# Patient Record
Sex: Female | Born: 1970
Health system: Southern US, Community
[De-identification: ages and names within clinical notes are randomized; demographics above are authoritative.]

## PROBLEM LIST (undated history)

## (undated) DIAGNOSIS — T7840XA Allergy, unspecified, initial encounter: Secondary | ICD-10-CM

## (undated) DIAGNOSIS — J301 Allergic rhinitis due to pollen: Secondary | ICD-10-CM

## (undated) DIAGNOSIS — B019 Varicella without complication: Secondary | ICD-10-CM

## (undated) DIAGNOSIS — N390 Urinary tract infection, site not specified: Secondary | ICD-10-CM

## (undated) HISTORY — DX: Urinary tract infection, site not specified: N39.0

## (undated) HISTORY — DX: Varicella without complication: B01.9

## (undated) HISTORY — DX: Allergic rhinitis due to pollen: J30.1

## (undated) HISTORY — DX: Allergy, unspecified, initial encounter: T78.40XA

## (undated) HISTORY — PX: KNEE ARTHROSCOPY: SUR90

---

## 2017-05-26 DIAGNOSIS — E786 Lipoprotein deficiency: Secondary | ICD-10-CM | POA: Insufficient documentation

## 2017-05-26 DIAGNOSIS — Z87898 Personal history of other specified conditions: Secondary | ICD-10-CM | POA: Insufficient documentation

## 2019-07-26 ENCOUNTER — Ambulatory Visit: Payer: Self-pay | Attending: Internal Medicine

## 2019-07-26 DIAGNOSIS — Z23 Encounter for immunization: Secondary | ICD-10-CM

## 2019-07-26 NOTE — Progress Notes (Signed)
   Covid-19 Vaccination Clinic  Name:  Tuesdae Sentman    MRN: [id number] DOB: 1970-10-28  07/26/2019  Ms. Christensen was observed post Covid-19 immunization for 15 minutes without incident. She was provided with Vaccine Information Sheet and instruction to access the V-Safe system.   Ms. Mechling was instructed to call 911 with any severe reactions post vaccine: Marland Kitchen Difficulty breathing  . Swelling of face and throat  . A fast heartbeat  . A bad rash all over body  . Dizziness and weakness   Immunizations Administered    Name Date Dose VIS Date Route   Pfizer COVID-19 Vaccine 07/26/2019  4:58 PM 0.3 mL 04/05/2019 Intramuscular   Manufacturer: [place]   Lot: [id number]   [place]: [id number]

## 2019-08-21 ENCOUNTER — Ambulatory Visit: Payer: Self-pay | Attending: Internal Medicine

## 2019-08-21 DIAGNOSIS — Z23 Encounter for immunization: Secondary | ICD-10-CM

## 2019-08-21 NOTE — Progress Notes (Signed)
   Covid-19 Vaccination Clinic  Name:  Megan Mills    MRN: [id number] DOB: 03/07/1971  08/21/2019  [name] was observed post Covid-19 immunization for 15 minutes without incident. She was provided with Vaccine Information Sheet and instruction to access the V-Safe system.   [name] was instructed to call 911 with any severe reactions post vaccine: Marland Kitchen Difficulty breathing  . Swelling of face and throat  . A fast heartbeat  . A bad rash all over body  . Dizziness and weakness   Immunizations Administered    Name Date Dose VIS Date Route   Pfizer COVID-19 Vaccine 08/21/2019 12:03 PM 0.3 mL 06/19/2018 Intramuscular   Manufacturer: [place]   Lot: [id number]   [place]: [id number]

## 2019-08-23 ENCOUNTER — Ambulatory Visit: Payer: Self-pay | Admitting: Family Medicine

## 2019-10-02 ENCOUNTER — Other Ambulatory Visit: Payer: Self-pay

## 2019-10-03 ENCOUNTER — Ambulatory Visit (INDEPENDENT_AMBULATORY_CARE_PROVIDER_SITE_OTHER): Payer: 59 | Admitting: Family Medicine

## 2019-10-03 ENCOUNTER — Encounter: Payer: Self-pay | Admitting: Family Medicine

## 2019-10-03 VITALS — BP 120/64 | HR 61 | Temp 98.2°F | Resp 18 | Ht 64.5 in | Wt 184.6 lb

## 2019-10-03 DIAGNOSIS — J301 Allergic rhinitis due to pollen: Secondary | ICD-10-CM | POA: Insufficient documentation

## 2019-10-03 DIAGNOSIS — L237 Allergic contact dermatitis due to plants, except food: Secondary | ICD-10-CM | POA: Diagnosis not present

## 2019-10-03 DIAGNOSIS — E669 Obesity, unspecified: Secondary | ICD-10-CM | POA: Diagnosis not present

## 2019-10-03 DIAGNOSIS — Z975 Presence of (intrauterine) contraceptive device: Secondary | ICD-10-CM

## 2019-10-03 DIAGNOSIS — Z0000 Encounter for general adult medical examination without abnormal findings: Secondary | ICD-10-CM | POA: Diagnosis not present

## 2019-10-03 DIAGNOSIS — F633 Trichotillomania: Secondary | ICD-10-CM | POA: Insufficient documentation

## 2019-10-03 LAB — COMPREHENSIVE METABOLIC PANEL
ALT: 11 U/L (ref 0–35)
AST: 16 U/L (ref 0–37)
Albumin: 4.4 g/dL (ref 3.5–5.2)
Alkaline Phosphatase: 108 U/L (ref 39–117)
BUN: 13 mg/dL (ref 6–23)
CO2: 27 mEq/L (ref 19–32)
Calcium: 8.9 mg/dL (ref 8.4–10.5)
Chloride: 105 mEq/L (ref 96–112)
Creatinine, Ser: 0.73 mg/dL (ref 0.40–1.20)
GFR: 84.84 mL/min (ref >60.00)
Glucose, Bld: 91 mg/dL (ref 70–99)
Potassium: 4.2 mEq/L (ref 3.5–5.1)
Sodium: 139 mEq/L (ref 135–145)
Total Bilirubin: 0.5 mg/dL (ref 0.2–1.2)
Total Protein: 6.6 g/dL (ref 6.0–8.3)

## 2019-10-03 LAB — LIPID PANEL
Cholesterol: 181 mg/dL (ref 0–200)
HDL: 45.7 mg/dL (ref >39.00)
LDL Cholesterol: 127 mg/dL — ABNORMAL HIGH (ref 0–99)
NonHDL: 135.22
Total CHOL/HDL Ratio: 4
Triglycerides: 43 mg/dL (ref 0.0–149.0)
VLDL: 8.6 mg/dL (ref 0.0–40.0)

## 2019-10-03 LAB — CBC WITH DIFFERENTIAL/PLATELET
Basophils Absolute: 0.1 10*3/uL (ref 0.0–0.1)
Basophils Relative: 1.1 % (ref 0.0–3.0)
Eosinophils Absolute: 0.2 10*3/uL (ref 0.0–0.7)
Eosinophils Relative: 3.4 % (ref 0.0–5.0)
HCT: 37.4 % (ref 36.0–46.0)
Hemoglobin: 12.7 g/dL (ref 12.0–15.0)
Lymphocytes Relative: 23.2 % (ref 12.0–46.0)
Lymphs Abs: 1.5 10*3/uL (ref 0.7–4.0)
MCHC: 33.8 g/dL (ref 30.0–36.0)
MCV: 89.7 fl (ref 78.0–100.0)
Monocytes Absolute: 0.4 10*3/uL (ref 0.1–1.0)
Monocytes Relative: 5.7 % (ref 3.0–12.0)
Neutro Abs: 4.4 10*3/uL (ref 1.4–7.7)
Neutrophils Relative %: 66.6 % (ref 43.0–77.0)
Platelets: 279 10*3/uL (ref 150.0–400.0)
RBC: 4.18 Mil/uL (ref 3.87–5.11)
RDW: 13.2 % (ref 11.5–15.5)
WBC: 6.7 10*3/uL (ref 4.0–10.5)

## 2019-10-03 NOTE — Patient Instructions (Addendum)
Please return in 12 months for your annual complete physical; please come fasting.  I will release your lab results to you on your MyChart account with further instructions. Please reply with any questions.   It was a pleasure meeting you today! Thank you for choosing [place] to meet your healthcare needs! I truly look forward to working with you. If you have any questions or concerns, please send me a message via Mychart or call the office at [phone].  Please do these things to maintain good health!   Exercise at least 30-45 minutes a day,  4-5 days a week.   Eat a low-fat diet with lots of fruits and vegetables, up to 7-9 servings per day.  Drink plenty of water daily. Try to drink 8 8oz glasses per day.  Seatbelts can save your life. Always wear your seatbelt.  Place Smoke Detectors on every level of your home and check batteries every year.  Schedule an appointment with an eye doctor for an eye exam every 1-2 years  Safe sex - use condoms to protect yourself from STDs if you could be exposed to these types of infections. Use birth control if you do not want to become pregnant and are sexually active.  Avoid heavy alcohol use. If you drink, keep it to less than 2 drinks/day and not every day.  [place].  Choose someone you trust that could speak for you if you became unable to speak for yourself.  Depression is common in our stressful world.If you're feeling down or losing interest in things you normally enjoy, please come in for a visit.  If anyone is threatening or hurting you, please get help. Physical or Emotional Violence is never OK.    Calcium Intake Recommendations You can take Caltrate Plus twice a day or get it through your diet or other OTC supplements (Viactiv, OsCal etc)  Calcium is a mineral that affects many functions in the body, including:  Blood clotting.  Blood vessel function.  Nerve impulse conduction.  Hormone  secretion.  Muscle contraction.  Bone and teeth functions.  Most of your body's calcium supply is stored in your bones and teeth. When your calcium stores are low, you may be at risk for low bone mass, bone loss, and bone fractures. Consuming enough calcium helps to grow healthy bones and teeth and to prevent breakdown over time. It is very important that you get enough calcium if you are:  A child undergoing rapid growth.  An adolescent girl.  A pre- or post-menopausal woman.  A woman whose menstrual cycle has stopped due to anorexia nervosa or regular intense exercise.  An individual with lactose intolerance or a milk allergy.  A vegetarian.  What is my plan? Try to consume the recommended amount of calcium daily based on your age. Depending on your overall health, your health care provider may recommend increased calcium intake.General daily calcium intake recommendations by age are:  Birth to 6 months: 200 mg.  Infants 7 to 12 months: 260 mg.  Children 1 to 3 years: 700 mg.  Children 4 to 8 years: 1,000 mg.  Children 9 to 13 years: 1,300 mg.  Teens 14 to 18 years: 1,300 mg.  Adults 19 to 50 years: 1,000 mg.  Adult women 51 to 70 years: 1,200 mg.  Adult men 51 to 70 years: 1,000 mg.  Adults 71 years and older: 1,200 mg.  Pregnant and breastfeeding teens: 1,300 mg.  Pregnant and breastfeeding adults:  1,000 mg.  What do I need to know about calcium intake?  In order for the body to absorb calcium, it needs vitamin D. You can get vitamin D through (we recommend getting [phone] units of Vitamin D daily) ? Direct exposure of the skin to sunlight. ? Foods, such as egg yolks, liver, saltwater fish, and fortified milk. ? Supplements.  Consuming too much calcium may cause: ? Constipation. ? Decreased absorption of iron and zinc. ? Kidney stones.  Calcium supplements may interact with certain medicines. Check with your health care provider before starting any  calcium supplements.  Try to get most of your calcium from food. What foods can I eat? Grains  Fortified oatmeal. Fortified ready-to-eat cereals. Fortified frozen waffles. Vegetables Turnip greens. Broccoli. Fruits Fortified orange juice. Meats and Other Protein Sources Canned sardines with bones. Canned salmon with bones. Soy beans. Tofu. Baked beans. Almonds. [place] nuts. Sunflower seeds. Dairy Milk. Yogurt. Cheese. Cottage cheese. Beverages Fortified soy milk. Fortified rice milk. Sweets/Desserts Pudding. Ice Cream. Milkshakes. Blackstrap molasses. The items listed above may not be a complete list of recommended foods or beverages. Contact your dietitian for more options. What foods can affect my calcium intake? It may be more difficult for your body to use calcium or calcium may leave your body more quickly if you consume large amounts of:  Sodium.  Protein.  Caffeine.  Alcohol.  This information is not intended to replace advice given to you by your health care provider. Make sure you discuss any questions you have with your health care provider. Document Released: 11/24/2003 Document Revised: 10/30/2015 Document Reviewed: 09/17/2013 Elsevier Interactive Patient Education  2018 [employer].

## 2019-10-03 NOTE — Progress Notes (Signed)
Subjective  Chief Complaint  Patient presents with  . Annual Exam    Would like to discuss whether she needs OB since we do pap smears. Fasting labs.   . Weight Gain  . Menopause  . Establish Care  . Rash    Concerns of a rash. Would like to dicuss the cream that was prescribed.    New pt; 15 minutes prior to visit to review records from [place] [facility] from care everywhere.  HPI: Megan Mills is a 49 y.o. female who presents to [facility] today for a Female Wellness Visit. She also has the concerns and/or needs as listed above in the chief complaint. These will be addressed in addition to the Health Maintenance Visit.   Wellness Visit: annual visit with health maintenance review and exam without Pap   HM: pap screen up to date. Healthy lifestyle. Married, mother of 29yo and 98 yo; relocated from [facility] October 2020. Doing well overall. 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 Ready to get back to work. Adjusting well overall. Working on weight loss. Rejoined weight loss. Does pilates and strength training 6days per week since November.   Chronic disease f/u and/or acute problem visit: (deemed necessary to be done in addition to the wellness visit):  IUD in place. Amenorrheic.    Assessment  1. Annual physical exam   2. IUD (intrauterine device) in place - Mirena   3. Obesity (BMI 30-39.9)   4. Poison ivy      Plan  Female Wellness Visit:  Age appropriate Health Maintenance and Prevention measures were discussed with patient. Included topics are cancer screening recommendations, ways to keep healthy (see AVS) including dietary and exercise recommendations, regular eye and dental care, use of seat belts, and avoidance of moderate alcohol use and tobacco use. mammo in feb 2022.   BMI: discussed patient's BMI and encouraged positive lifestyle modifications to help get to or maintain a target BMI.  HM needs and immunizations were addressed and  ordered. See below for orders. See HM and immunization section for updates.  Routine labs and screening tests ordered including cmp, cbc and lipids where appropriate.  Discussed recommendations regarding Vit D and calcium supplementation (see AVS)  Follow up: Return in about 1 year (around 10/02/2020) for complete physical.  Orders Placed This Encounter  Procedures  . CBC with Differential/Platelet  . Comprehensive metabolic panel  . Lipid panel  . HIV Antibody (routine testing w rflx)  . Hepatitis C antibody   No orders of the defined types were placed in this encounter.     Lifestyle: Body mass index is 31.2 kg/m. Wt Readings from Last 3 Encounters:  10/03/19 184 lb 9.6 oz (83.7 kg)    Patient Active Problem List   Diagnosis Date Noted  . Allergic rhinitis due to pollen 10/03/2019  . Trichotillomania 10/03/2019  . IUD (intrauterine device) in place - Mirena 10/03/2019  . Obesity (BMI 30-39.9) 10/03/2019   Health Maintenance  Topic Date Due  . Hepatitis C Screening  Never done  . HIV Screening  Never done  . INFLUENZA VACCINE  11/24/2019  . MAMMOGRAM  05/26/2020  . PAP SMEAR-Modifier  05/09/2021  . TETANUS/TDAP  04/22/2026  . COVID-19 Vaccine  Completed   Immunization History  Administered Date(s) Administered  . Influenza, Seasonal, Injecte, Preservative Fre 12/24/2012, 01/15/2014  . PFIZER SARS-COV-2 Vaccination 07/26/2019, 08/21/2019  . Tdap 04/22/2016   We updated and reviewed the patient's past history  in detail and it is documented below. Allergies: Patient is allergic to penicillins. Past Medical History Patient  has a past medical history of Allergy, Chicken pox, Hay fever, and UTI (urinary tract infection). Past Surgical History Patient  has a past surgical history that includes Knee arthroscopy (Left). Family History: Patient family history includes Bipolar disorder in her mother; Depression in her mother; Hearing loss in her maternal grandmother;  Heart attack in her paternal grandmother; Hypertension in her maternal grandmother and mother; Mental illness in her mother. Social History:  Patient  reports that she has never smoked. She has never used smokeless tobacco. She reports current alcohol use. She reports that she does not use drugs.  Review of Systems: Constitutional: negative for fever or malaise Ophthalmic: negative for photophobia, double vision or loss of vision Cardiovascular: negative for chest pain, dyspnea on exertion, or new LE swelling Respiratory: negative for SOB or persistent cough Gastrointestinal: negative for abdominal pain, change in bowel habits or melena Genitourinary: negative for dysuria or gross hematuria, no abnormal uterine bleeding or disharge Musculoskeletal: negative for new gait disturbance or muscular weakness Integumentary: negative for new or persistent rashes, no breast lumps Neurological: negative for TIA or stroke symptoms Psychiatric: negative for SI or delusions Allergic/Immunologic: negative for hives  Patient Care Team    Relationship Specialty Notifications Start End  Leamon Arnt, MD PCP - General Family Medicine  10/03/19   Levy Sjogren, MD Referring Physician Dermatology  10/03/19     Objective  Vitals: BP 120/64   Pulse 61   Temp 98.2 F (36.8 C) (Temporal)   Resp 18   Ht 5' 4.5" (1.638 m)   Wt 184 lb 9.6 oz (83.7 kg)   SpO2 97%   BMI 31.20 kg/m  General:  Well developed, well nourished, no acute distress  Psych:  Alert and orientedx3,normal mood and affect HEENT:  Normocephalic, atraumatic, non-icteric sclera,  supple neck without adenopathy, mass or thyromegaly Cardiovascular:  Normal S1, S2, RRR without gallop, rub or murmur Respiratory:  Good breath sounds bilaterally, CTAB with normal respiratory effort Gastrointestinal: normal bowel sounds, soft, non-tender, no noted masses. No HSM MSK: no deformities, contusions. Joints are without erythema or  swelling.  Skin:  Warm, linear vesicular rash on inner right forearm. Neurologic:    Mental status is normal. CN 2-11 are normal. Gross motor and sensory exams are normal. Normal gait. No tremor Breast Exam: No mass, skin retraction or nipple discharge is appreciated in either breast. No axillary adenopathy. Fibrocystic changes are not noted    Commons side effects, risks, benefits, and alternatives for medications and treatment plan prescribed today were discussed, and the patient expressed understanding of the given instructions. Patient is instructed to call or message via MyChart if he/she has any questions or concerns regarding our treatment plan. No barriers to understanding were identified. We discussed Red Flag symptoms and signs in detail. Patient expressed understanding regarding what to do in case of urgent or emergency type symptoms.   Medication list was reconciled, printed and provided to the patient in AVS. Patient instructions and summary information was reviewed with the patient as documented in the AVS. This note was prepared with assistance of Dragon voice recognition software. Occasional wrong-word or sound-a-like substitutions may have occurred due to the inherent limitations of voice recognition software  This visit occurred during the SARS-CoV-2 public health emergency.  Safety protocols were in place, including screening questions prior to the visit, additional usage of staff PPE, and  extensive cleaning of exam room while observing appropriate contact time as indicated for disinfecting solutions.

## 2019-10-04 LAB — HEPATITIS C ANTIBODY
Hepatitis C Ab: NONREACTIVE
SIGNAL TO CUT-OFF: 0.01 (ref <1.00)

## 2019-10-04 LAB — HIV ANTIBODY (ROUTINE TESTING W REFLEX): HIV 1&2 Ab, 4th Generation: NONREACTIVE

## 2019-10-29 ENCOUNTER — Telehealth: Payer: Self-pay | Admitting: Family Medicine

## 2019-10-29 NOTE — Telephone Encounter (Signed)
Patient states provider who placed Mirena advised to have it removed in 5-6 years. Patient is wanting to discuss all IUD/BC options with PCP. Appt has been made for 11/08/19

## 2019-10-29 NOTE — Telephone Encounter (Signed)
Last time she received the Mirena was September 2015, wanted to know what is the next step would be -removal or if okay to keep.

## 2019-10-29 NOTE — Telephone Encounter (Signed)
Patient is calling asking if Dr.Andy had put in for her to have an A1c when she came to her appointment.

## 2019-10-29 NOTE — Telephone Encounter (Signed)
Please advise if time for removal or if OK to keep til 2022?

## 2019-10-29 NOTE — Telephone Encounter (Signed)
Pt had a normal fasting glucose; therefore doesn't need an a1c.  And Mirena was due out in 2020 then.  Does she want another? Can refer to gyn or can order and set her up for removal / replacement here or can just remove it.   If she is uncertain, then office visit to discuss options.  However, it is no longer functioning as a contraceptive.  Thanks.

## 2019-11-25 ENCOUNTER — Other Ambulatory Visit: Payer: Self-pay

## 2019-11-25 ENCOUNTER — Ambulatory Visit: Payer: 59 | Admitting: Family Medicine

## 2019-11-25 ENCOUNTER — Encounter: Payer: Self-pay | Admitting: Family Medicine

## 2019-11-25 VITALS — BP 110/78 | HR 105 | Temp 98.5°F | Resp 16 | Ht 65.0 in | Wt 190.4 lb

## 2019-11-25 DIAGNOSIS — Z975 Presence of (intrauterine) contraceptive device: Secondary | ICD-10-CM | POA: Diagnosis not present

## 2019-11-25 DIAGNOSIS — Z1231 Encounter for screening mammogram for malignant neoplasm of breast: Secondary | ICD-10-CM | POA: Diagnosis not present

## 2019-11-25 NOTE — Patient Instructions (Signed)
Please return in June 2022 for your annual complete physical; please come fasting.  We or the GYN office will call you with information regarding your referral appointment. I recommend Dr. Terri Piedra or any physician with [facility].  If you do not hear from us/thm within the next 2 weeks, please let me know. It can take 1-2 weeks to get appointments set up with the specialists.   IUD replacement is a perfect option for you.   I have ordered a mammogram and/or bone density for you as we discussed today: [x]   Mammogram  []   Bone Density  Please call the office checked below to schedule your appointment: Your appointment will at the following location  [x]   [facility]      [place], [place]         []   [facility]  [place], [place]   If you have any questions or concerns, please don't hesitate to send me a message via MyChart or call the office at [phone]. Thank you for visiting with [place] today! It's our pleasure caring for you.

## 2019-11-25 NOTE — Progress Notes (Signed)
Subjective  CC:  Chief Complaint  Patient presents with  . Contraception    currently has mirena    HPI: Megan Mills is a 49 y.o. female who presents to the office today to address the problems listed above in the chief complaint.  Contraception: 49 year old female who currently has a Mirena IUD in place.  She states it was placed about 6 years ago, September 2015.  This is her second IUD.  She has been amenorrheic with both IUDs.  She feels well.  Perhaps has had some weight gain but otherwise likes it for birth control.  She would like to discuss future birth control methods.  She has not had specific menopausal symptoms including hot flashes, vaginal dryness but has noted some mild moodiness at times.  She does not want to have another child.  Her husband is not interested in a vasectomy.  She would prefer not to use birth control pills or patches.  Cervical cancer screening has been normal and is up-to-date.  She is monogamous and married.  She has 2 children 70 and 6 years old.  No contraindications to progesterone or estrogen are noted.  Assessment  1. IUD contraception   2. Encounter for screening mammogram for breast cancer      Plan   IUD contraception: After counseling, another Mirena IUD would be a good choice for her.  She should continue to be amenorrheic, and would get her through to menopause.  Refer to GYN for IUD removal and replacement.  Mammogram screening ordered.  Follow up: June 2022 for complete physical. Visit date not found  Orders Placed This Encounter  Procedures  . MM DIGITAL SCREENING BILATERAL  . Ambulatory referral to Obstetrics / Gynecology   No orders of the defined types were placed in this encounter.     I reviewed the patients updated PMH, FH, and SocHx.    Patient Active Problem List   Diagnosis Date Noted  . Allergic rhinitis due to pollen 10/03/2019  . Trichotillomania 10/03/2019  . IUD (intrauterine device) in place - Mirena  10/03/2019  . Obesity (BMI 30-39.9) 10/03/2019   Current Meds  Medication Sig  . levonorgestrel (MIRENA) 20 MCG/24HR IUD by Intrauterine route. Every 5 years    Allergies: Patient is allergic to penicillins. Family History: Patient family history includes Bipolar disorder in her mother; Depression in her mother; Hearing loss in her maternal grandmother; Heart attack in her paternal grandmother; Hypertension in her maternal grandmother and mother; Mental illness in her mother. Social History:  Patient  reports that she has never smoked. She has never used smokeless tobacco. She reports current alcohol use. She reports that she does not use drugs.  Review of Systems: Constitutional: Negative for fever malaise or anorexia Cardiovascular: negative for chest pain Respiratory: negative for SOB or persistent cough Gastrointestinal: negative for abdominal pain  Objective  Vitals: BP 110/78   Pulse (!) 105   Temp 98.5 F (36.9 C) (Temporal)   Resp 16   Ht 5\' 5"  (1.651 m)   Wt 190 lb 6.4 oz (86.4 kg)   SpO2 98%   BMI 31.68 kg/m  General: no acute distress , A&Ox3    Commons side effects, risks, benefits, and alternatives for medications and treatment plan prescribed today were discussed, and the patient expressed understanding of the given instructions. Patient is instructed to call or message via MyChart if he/she has any questions or concerns regarding our treatment plan. No barriers to understanding were identified. We  discussed Red Flag symptoms and signs in detail. Patient expressed understanding regarding what to do in case of urgent or emergency type symptoms.   Medication list was reconciled, printed and provided to the patient in AVS. Patient instructions and summary information was reviewed with the patient as documented in the AVS. This note was prepared with assistance of Dragon voice recognition software. Occasional wrong-word or sound-a-like substitutions may have occurred  due to the inherent limitations of voice recognition software  This visit occurred during the SARS-CoV-2 public health emergency.  Safety protocols were in place, including screening questions prior to the visit, additional usage of staff PPE, and extensive cleaning of exam room while observing appropriate contact time as indicated for disinfecting solutions.

## 2019-12-11 ENCOUNTER — Ambulatory Visit
Admission: RE | Admit: 2019-12-11 | Discharge: 2019-12-11 | Disposition: A | Discharge status: Home / Self Care | Payer: 59 | Source: Ambulatory Visit | Attending: Family Medicine | Admitting: Family Medicine

## 2019-12-11 ENCOUNTER — Other Ambulatory Visit: Payer: Self-pay

## 2019-12-11 DIAGNOSIS — Z1231 Encounter for screening mammogram for malignant neoplasm of breast: Secondary | ICD-10-CM

## 2020-01-03 ENCOUNTER — Ambulatory Visit: Payer: 59 | Admitting: Family Medicine

## 2020-06-07 ENCOUNTER — Other Ambulatory Visit (HOSPITAL_COMMUNITY): Payer: Self-pay

## 2020-06-07 ENCOUNTER — Ambulatory Visit (HOSPITAL_COMMUNITY)
Admission: EM | Admit: 2020-06-07 | Discharge: 2020-06-07 | Disposition: A | Discharge status: Still In Hospital | Payer: Managed Care, Other (non HMO)

## 2020-06-07 ENCOUNTER — Emergency Department (HOSPITAL_COMMUNITY)
Admission: EM | Admit: 2020-06-07 | Discharge: 2020-06-07 | Disposition: A | Discharge status: Home / Self Care | Payer: Managed Care, Other (non HMO) | Attending: Emergency Medicine | Admitting: Emergency Medicine

## 2020-06-07 ENCOUNTER — Other Ambulatory Visit (HOSPITAL_COMMUNITY): Payer: Managed Care, Other (non HMO)

## 2020-06-07 ENCOUNTER — Emergency Department (HOSPITAL_COMMUNITY): Payer: Managed Care, Other (non HMO)

## 2020-06-07 ENCOUNTER — Other Ambulatory Visit: Payer: Self-pay

## 2020-06-07 ENCOUNTER — Encounter (HOSPITAL_COMMUNITY): Payer: Self-pay | Admitting: Emergency Medicine

## 2020-06-07 DIAGNOSIS — W19XXXA Unspecified fall, initial encounter: Secondary | ICD-10-CM

## 2020-06-07 DIAGNOSIS — R519 Headache, unspecified: Secondary | ICD-10-CM | POA: Insufficient documentation

## 2020-06-07 DIAGNOSIS — S300XXA Contusion of lower back and pelvis, initial encounter: Secondary | ICD-10-CM | POA: Diagnosis not present

## 2020-06-07 DIAGNOSIS — R609 Edema, unspecified: Secondary | ICD-10-CM | POA: Insufficient documentation

## 2020-06-07 DIAGNOSIS — R0789 Other chest pain: Secondary | ICD-10-CM | POA: Diagnosis not present

## 2020-06-07 DIAGNOSIS — S3210XA Unspecified fracture of sacrum, initial encounter for closed fracture: Secondary | ICD-10-CM | POA: Insufficient documentation

## 2020-06-07 DIAGNOSIS — S5012XA Contusion of left forearm, initial encounter: Secondary | ICD-10-CM | POA: Diagnosis not present

## 2020-06-07 DIAGNOSIS — W000XXA Fall on same level due to ice and snow, initial encounter: Secondary | ICD-10-CM | POA: Diagnosis not present

## 2020-06-07 DIAGNOSIS — S34139A Unspecified injury to sacral spinal cord, initial encounter: Secondary | ICD-10-CM | POA: Diagnosis present

## 2020-06-07 LAB — LIPASE, BLOOD: Lipase: 26 U/L (ref 11–51)

## 2020-06-07 LAB — BASIC METABOLIC PANEL
Anion gap: 7 (ref 5–15)
BUN: 11 mg/dL (ref 6–20)
CO2: 27 mmol/L (ref 22–32)
Calcium: 9.5 mg/dL (ref 8.9–10.3)
Chloride: 106 mmol/L (ref 98–111)
Creatinine, Ser: 0.77 mg/dL (ref 0.44–1.00)
GFR, Estimated: >60 mL/min (ref >60)
Glucose, Bld: 139 mg/dL — ABNORMAL HIGH (ref 70–99)
Potassium: 3.7 mmol/L (ref 3.5–5.1)
Sodium: 140 mmol/L (ref 135–145)

## 2020-06-07 LAB — CBC
HCT: 39.9 % (ref 36.0–46.0)
Hemoglobin: 12.7 g/dL (ref 12.0–15.0)
MCH: 28.7 pg (ref 26.0–34.0)
MCHC: 31.8 g/dL (ref 30.0–36.0)
MCV: 90.3 fL (ref 80.0–100.0)
Platelets: 284 10*3/uL (ref 150–400)
RBC: 4.42 MIL/uL (ref 3.87–5.11)
RDW: 12.4 % (ref 11.5–15.5)
WBC: 7.9 10*3/uL (ref 4.0–10.5)
nRBC: 0 % (ref 0.0–0.2)

## 2020-06-07 LAB — I-STAT BETA HCG BLOOD, ED (MC, WL, AP ONLY): I-stat hCG, quantitative: <5 m[IU]/mL (ref <5)

## 2020-06-07 LAB — HEPATIC FUNCTION PANEL
ALT: 17 U/L (ref 0–44)
AST: 19 U/L (ref 15–41)
Albumin: 4 g/dL (ref 3.5–5.0)
Alkaline Phosphatase: 112 U/L (ref 38–126)
Bilirubin, Direct: <0.1 mg/dL (ref 0.0–0.2)
Total Bilirubin: 0.5 mg/dL (ref 0.3–1.2)
Total Protein: 7 g/dL (ref 6.5–8.1)

## 2020-06-07 LAB — TROPONIN I (HIGH SENSITIVITY)
Troponin I (High Sensitivity): <2 ng/L (ref <18)
Troponin I (High Sensitivity): <2 ng/L (ref <18)

## 2020-06-07 LAB — TSH: TSH: 0.789 u[IU]/mL (ref 0.350–4.500)

## 2020-06-07 LAB — D-DIMER, QUANTITATIVE: D-Dimer, Quant: <0.27 ug/mL-FEU (ref 0.00–0.50)

## 2020-06-07 MED ORDER — SODIUM CHLORIDE 0.9 % IV BOLUS
1000.0000 mL | Freq: Once | INTRAVENOUS | Status: AC
  Administered 2020-06-07: 1000 mL via INTRAVENOUS

## 2020-06-07 NOTE — ED Triage Notes (Signed)
Pt slipped and fell on ice on Monday.  Reports bruising and swelling to L forearm that has improved.  Reports bruising and pain to tailbone that has continued.  This morning she had black spots in her vision when she went into her son's room but it went away.  While in church she started having epigastric pain, SOB, and feeling hot.  States she is unsure if it was related to wearing a mask or the person's cologne in front of her.  Also reports bilateral legs feeling heavy since yesterday and lower edema swelling yesterday that improved overnight.

## 2020-06-07 NOTE — ED Provider Notes (Signed)
[facility] Provider Note   CSN: [id number] Arrival date & time: 06/07/20  1127     History Chief Complaint  Patient presents with  . Fall    Epigastric Pain  . Shortness of Breath    Megan Mills is a 50 y.o. female.  Pt presents to the ED today with several complaints.  Pt slipped on the ice Monday (2/7).  She fell on her tailbone and to her left arm.  The pt has bruising which is improving.  Her left arm no longer hurts and she's been able to do pilates, but her tailbone is painful.  Pt said she did not hit her head.  No loc.  Today, pt had some black spots to her later vision on both sides this morning.  It went away.  While at church, she felt like she could not breathe and had some epigastric pain.  She never gets heartburn and was worried about that.  Pt also has some swelling in both lower legs and they feel heavy.  She said that has improved with elevation.        Past Medical History:  Diagnosis Date  . Allergy   . Chicken pox   . Hay fever   . UTI (urinary tract infection)     Patient Active Problem List   Diagnosis Date Noted  . Allergic rhinitis due to pollen 10/03/2019  . Trichotillomania 10/03/2019  . IUD (intrauterine device) in place - Mirena 10/03/2019  . Obesity (BMI 30-39.9) 10/03/2019    Past Surgical History:  Procedure Laterality Date  . KNEE ARTHROSCOPY Left      OB History   No obstetric history on file.     Family History  Problem Relation Age of Onset  . Bipolar disorder Mother   . Depression Mother   . Mental illness Mother   . Hypertension Mother   . Hearing loss Maternal Grandmother   . Hypertension Maternal Grandmother   . Heart attack Paternal Grandmother     Social History   Tobacco Use  . Smoking status: Never Smoker  . Smokeless tobacco: Never Used  Substance Use Topics  . Alcohol use: Yes  . Drug use: Never    Home Medications Prior to Admission medications    Medication Sig Start Date End Date Taking? Authorizing Provider  levonorgestrel (MIRENA) 20 MCG/24HR IUD 1 each by Intrauterine route once. Every 5 years   Yes [provider]    Allergies    Penicillins  Review of Systems   Review of Systems  Respiratory: Positive for shortness of breath.   Cardiovascular: Positive for leg swelling.  Gastrointestinal: Positive for abdominal pain.  Musculoskeletal:       Tailbone pain  All other systems reviewed and are negative.   Physical Exam Updated Vital Signs BP 127/85   Pulse 75   Temp 98.4 F (36.9 C) (Oral)   Resp 16   Ht 5\' 5"  (1.651 m)   Wt 83.9 kg   SpO2 100%   BMI 30.79 kg/m   Physical Exam Vitals and nursing note reviewed.  Constitutional:      Appearance: She is well-developed.  HENT:     Head: Normocephalic and atraumatic.     Mouth/Throat:     Mouth: Mucous membranes are moist.     Pharynx: Oropharynx is clear.  Eyes:     Extraocular Movements: Extraocular movements intact.     Pupils: Pupils are equal, round, and reactive to light.  Cardiovascular:     Rate and Rhythm: Normal rate and regular rhythm.  Pulmonary:     Effort: Pulmonary effort is normal.     Breath sounds: Normal breath sounds.  Abdominal:     General: Bowel sounds are normal.     Palpations: Abdomen is soft.  Musculoskeletal:        General: Normal range of motion.     Cervical back: Normal range of motion and neck supple.     Comments: Left forearm bruising.  No bony tenderness. Left buttock bruising.  Skin:    General: Skin is warm.     Capillary Refill: Capillary refill takes less than 2 seconds.  Neurological:     General: No focal deficit present.     Mental Status: She is alert and oriented to person, place, and time.  Psychiatric:        Behavior: Behavior normal.     ED Results / Procedures / Treatments   Labs (all labs ordered are listed, but only abnormal results are displayed) Labs Reviewed  BASIC METABOLIC  PANEL - Abnormal; Notable for the following components:      Result Value   Glucose, Bld 139 (*)    All other components within normal limits  CBC  HEPATIC FUNCTION PANEL  LIPASE, BLOOD  D-DIMER, QUANTITATIVE (NOT AT [facility])  TSH  URINALYSIS, ROUTINE W REFLEX MICROSCOPIC  I-STAT BETA HCG BLOOD, ED ([facility], [facility], [facility] ONLY)  TROPONIN I (HIGH SENSITIVITY)  TROPONIN I (HIGH SENSITIVITY)    EKG EKG Interpretation  Date/Time:  Sunday June 07 2020 11:44:08 EST Ventricular Rate:  95 PR Interval:  150 QRS Duration: 74 QT Interval:  340 QTC Calculation: 427 R Axis:   83 Text Interpretation: Normal sinus rhythm with sinus arrhythmia Cannot rule out Anterior infarct , age undetermined Abnormal ECG No old tracing to compare Confirmed by Isla Pence [phone]) on 06/07/2020 12:27:55 PM   Radiology DG Chest 2 View  Result Date: 06/07/2020 CLINICAL DATA:  Epigastric pain and shortness of breath. EXAM: CHEST - 2 VIEW COMPARISON:  None. FINDINGS: The heart size and mediastinal contours are within normal limits. Both lungs are clear. The visualized skeletal structures are unremarkable. IMPRESSION: No active cardiopulmonary disease. Electronically Signed   By: Titus Dubin M.D.   On: 06/07/2020 12:16   DG Sacrum/Coccyx  Result Date: 06/07/2020 CLINICAL DATA:  Pain fall going fall EXAM: SACRUM AND COCCYX - 2+ VIEW COMPARISON:  None. FINDINGS: Frontal, angled frontal, and lateral views were obtained. There is a fracture along the anterior aspect of the upper coccyx with alignment near anatomic. No other fracture. No diastasis. Joint spaces appear normal. Intrauterine device present slightly to the right of midline in the pelvis. IMPRESSION: Avulsion type fracture along the anterior mid coccyx with alignment near anatomic at the fracture site. No other evident fracture. No appreciable arthropathy. Intrauterine device present. Electronically Signed   By: Lowella Grip III M.D.   On: 06/07/2020 13:08    CT Head Wo Contrast  Result Date: 06/07/2020 CLINICAL DATA:  Worsening headache. Patient fell and hit head 1 week ago. EXAM: CT HEAD WITHOUT CONTRAST TECHNIQUE: Contiguous axial images were obtained from the base of the skull through the vertex without intravenous contrast. COMPARISON:  None. FINDINGS: Brain: The ventricles appears slightly prominent for the patient's age but they are normal in configuration and in the midline without mass effect or shift. I do not see any significant cerebral atrophy. The gray-white differentiation is maintained. No CT  findings for hemispheric infarction or intracranial hemorrhage. No mass lesions. No extra-axial fluid collections are identified. The brainstem and cerebellum are unremarkable. Vascular: Minimal vascular calcifications. No aneurysm or hyperdense vessels. Skull: No skull fracture or bone lesions. Sinuses/Orbits: The paranasal sinuses and mastoid air cells are clear. The globes are intact. Other: No scalp lesions or scalp hematoma. IMPRESSION: No acute intracranial findings or skull fracture. Electronically Signed   By: Marijo Sanes M.D.   On: 06/07/2020 13:20    Procedures Procedures   Medications Ordered in ED Medications  sodium chloride 0.9 % bolus 1,000 mL (0 mLs Intravenous Stopped 06/07/20 1344)    ED Course  I have reviewed the triage vital signs and the nursing notes.  Pertinent labs & imaging results that were available during my care of the patient were reviewed by me and considered in my medical decision making (see chart for details).    MDM Rules/Calculators/A&P                          Eval is negative other than a tailbone fx.  Pt is stable for d/c.  She knows to return if worse.  F/u with pcp. Final Clinical Impression(s) / ED Diagnoses Final diagnoses:  Closed fracture of sacrum and coccyx, initial encounter ([place])  Fall, initial encounter  Peripheral edema  Atypical chest pain    Rx / DC Orders ED Discharge Orders     None       Isla Pence, MD 06/07/20 1453

## 2020-06-11 ENCOUNTER — Ambulatory Visit (INDEPENDENT_AMBULATORY_CARE_PROVIDER_SITE_OTHER): Payer: Managed Care, Other (non HMO) | Admitting: Family Medicine

## 2020-06-11 ENCOUNTER — Encounter: Payer: Self-pay | Admitting: Family Medicine

## 2020-06-11 ENCOUNTER — Other Ambulatory Visit: Payer: Self-pay

## 2020-06-11 VITALS — BP 124/86 | HR 103 | Temp 98.4°F | Resp 17 | Wt 189.4 lb

## 2020-06-11 DIAGNOSIS — G44209 Tension-type headache, unspecified, not intractable: Secondary | ICD-10-CM

## 2020-06-11 DIAGNOSIS — S3210XD Unspecified fracture of sacrum, subsequent encounter for fracture with routine healing: Secondary | ICD-10-CM | POA: Diagnosis not present

## 2020-06-11 DIAGNOSIS — F458 Other somatoform disorders: Secondary | ICD-10-CM

## 2020-06-11 DIAGNOSIS — S322XXD Fracture of coccyx, subsequent encounter for fracture with routine healing: Secondary | ICD-10-CM

## 2020-06-11 DIAGNOSIS — S5012XD Contusion of left forearm, subsequent encounter: Secondary | ICD-10-CM | POA: Diagnosis not present

## 2020-06-11 NOTE — Progress Notes (Signed)
Subjective  CC:   Chief Complaint  Patient presents with  . Closed fracture of sacrum and coccyx    Fell on 2/7, seen in ER on 2/13 - diagnosed with tailbone fracture, area is still bruised and experiencing ongoing pain  . Arm Pain    Left arm/elbow, no imaging performed during ER visit     HPI: Megan Mills is a 50 y.o. female who presents to the office today to address the problems listed above in the chief complaint.  50 year old who has above-noted, found the ice nitroglycerin. I reviewed emergency room notes, imaging studies, lab reports from the February 13 visit. Patient reports she slipped, her feet fell out from under her and she landed directly on her tailbone and left arm. Emergency room evaluation diagnosed a closed small avulsion fracture of the coccyx. Head CT was negative, EKG was unremarkable, lab tests were unremarkable.  Since, overall she is doing well. Has some soreness on her tailbone. Sitting on a doughnut. Unable to exercise. She has left forearm soreness, bruising is improving. She does feel a knot. She is able to use her arm without any limitations. Even able to plank without pain.  New problem, complains of bitemporal headaches. These started prior to her fall. Also has noted that she grinds her teeth in the middle of the day. She finds herself clenching. She is using Aleve almost daily for headaches. She denies unilateral headaches, no associated photophobia or nausea. No severe headaches. No neurologic symptoms. She admits to persistent life stressors. No visual changes. Assessment  1. Closed fracture of sacrum and coccyx with routine healing, subsequent encounter   2. Contusion of left forearm, subsequent encounter   3. Tension headache   4. Bruxism (teeth grinding)      Plan   Tailbone fracture from fall: education on limiting exercise until heals. Stable  Contusion of left forearm: Reassured. Supportive care.  Bruxism and tension headaches:  Education and prognosis given. Avoid daily Aleve. Start massage, heat, muscle relaxation exercises discussed. Follow-up if not proving. No red flags identified.  Follow up: Return in about 4 months (around 10/09/2020) for complete physical.  Visit date not found  No orders of the defined types were placed in this encounter.  No orders of the defined types were placed in this encounter.     I reviewed the patients updated PMH, FH, and SocHx.    Patient Active Problem List   Diagnosis Date Noted  . Allergic rhinitis due to pollen 10/03/2019  . Trichotillomania 10/03/2019  . IUD (intrauterine device) in place - Mirena 10/03/2019  . Obesity (BMI 30-39.9) 10/03/2019   Current Meds  Medication Sig  . CALCIUM PO Take by mouth.  . COLLAGEN PO Take by mouth.  . levonorgestrel (MIRENA) 20 MCG/24HR IUD 1 each by Intrauterine route once. Every 5 years  . VITAMIN D PO Take by mouth.    Allergies: Patient is allergic to penicillins. Family History: Patient family history includes Bipolar disorder in her mother; Depression in her mother; Hearing loss in her maternal grandmother; Heart attack in her paternal grandmother; Hypertension in her maternal grandmother and mother; Mental illness in her mother. Social History:  Patient  reports that she has never smoked. She has never used smokeless tobacco. She reports current alcohol use. She reports that she does not use drugs.  Review of Systems: Constitutional: Negative for fever malaise or anorexia Cardiovascular: negative for chest pain Respiratory: negative for SOB or persistent cough Gastrointestinal: negative for abdominal  pain  Objective  Vitals: BP 124/86   Pulse (!) 103   Temp 98.4 F (36.9 C) (Temporal)   Resp 17   Wt 189 lb 6.4 oz (85.9 kg)   SpO2 98%   BMI 31.52 kg/m  General: no acute distress , A&Ox3, appears well HEENT: Bitemporal tenderness with normal temporal artery pulsations present Left forearm with resolving  ecchymosis, no bony tenderness, mild tenderness and hematoma noted in the bulk of the flexor muscle     Commons side effects, risks, benefits, and alternatives for medications and treatment plan prescribed today were discussed, and the patient expressed understanding of the given instructions. Patient is instructed to call or message via MyChart if he/she has any questions or concerns regarding our treatment plan. No barriers to understanding were identified. We discussed Red Flag symptoms and signs in detail. Patient expressed understanding regarding what to do in case of urgent or emergency type symptoms.   Medication list was reconciled, printed and provided to the patient in AVS. Patient instructions and summary information was reviewed with the patient as documented in the AVS. This note was prepared with assistance of Dragon voice recognition software. Occasional wrong-word or sound-a-like substitutions may have occurred due to the inherent limitations of voice recognition software  This visit occurred during the SARS-CoV-2 public health emergency.  Safety protocols were in place, including screening questions prior to the visit, additional usage of staff PPE, and extensive cleaning of exam room while observing appropriate contact time as indicated for disinfecting solutions.

## 2020-06-11 NOTE — Patient Instructions (Addendum)
Please return in June 2022 for your annual complete physical; please come fasting.   If you have any questions or concerns, please don't hesitate to send me a message via MyChart or call the office at [phone]. Thank you for visiting with [place] today! It's our pleasure caring for you.   Tension Headache, Adult A tension headache is a feeling of pain, pressure, or aching over the front and sides of the head. The pain can be dull, or it can feel tight. There are two types of tension headache:  Episodic tension headache. This is when the headaches happen fewer than 15 days a month.  Chronic tension headache. This is when the headaches happen more than 15 days a month during a 12-month period. A tension headache can last from 30 minutes to several days. It is the most common kind of headache. Tension headaches are not normally associated with nausea or vomiting, and they do not get worse with physical activity. What are the causes? The exact cause of this condition is not known. Tension headaches are often triggered by stress, anxiety, or depression. Other triggers may include:  Alcohol.  Too much caffeine or caffeine withdrawal.  Respiratory infections, such as colds, flu, or sinus infections.  Dental problems or teeth clenching.  Fatigue.  Holding your head and neck in the same position for a long period of time, such as while using a computer.  Smoking.  Arthritis of the neck. What are the signs or symptoms? Symptoms of this condition include:  A feeling of pressure or tightness around the head.  Dull, aching head pain.  Pain over the front and sides of the head.  Tenderness in the muscles of the head, neck, and shoulders. How is this diagnosed? This condition may be diagnosed based on your symptoms, your medical history, and a physical exam. If your symptoms are severe or unusual, you may have imaging tests, such as a CT scan or an MRI of your head. Your vision may also be  checked. How is this treated? This condition may be treated with lifestyle changes and with medicines that help relieve symptoms. Follow these instructions at home: Managing pain  Take over-the-counter and prescription medicines only as told by your health care provider.  When you have a headache, lie down in a dark, quiet room.  If directed, put ice on your head and neck. To do this: ? Put ice in a plastic bag. ? Place a towel between your skin and the bag. ? Leave the ice on for 20 minutes, 2-3 times a day. ? Remove the ice if your skin turns bright red. This is very important. If you cannot feel pain, heat, or cold, you have a greater risk of damage to the area.  If directed, apply heat to the back of your neck as often as told by your health care provider. Use the heat source that your health care provider recommends, such as a moist heat pack or a heating pad. ? Place a towel between your skin and the heat source. ? Leave the heat on for 20-30 minutes. ? Remove the heat if your skin turns bright red. This is especially important if you are unable to feel pain, heat, or cold. You have a greater risk of getting burned. Eating and drinking  Eat meals on a regular schedule.  If you drink alcohol: ? Limit how much you have to:  0-1 drink a day for women who are not pregnant.  0-2 drinks  a day for men. ? Know how much alcohol is in your drink. In the [place] one drink equals one 12 oz bottle of beer (355 mL), one 5 oz glass of wine (148 mL), or one 1 oz glass of hard liquor (44 mL).  Drink enough fluid to keep your urine pale yellow.  Decrease your caffeine intake, or stop using caffeine. Lifestyle  Get 7-9 hours of sleep each night, or get the amount of sleep recommended by your health care provider.  At bedtime, remove computers, phones, and tablets from your room.  Find ways to manage your stress. This may include: ? Exercise. ? Deep breathing  exercises. ? Yoga. ? Listening to music. ? Positive mental imagery.  Try to sit up straight and avoid tensing your muscles.  Do not use any products that contain nicotine or tobacco. These include cigarettes, chewing tobacco, and vaping devices, such as e-cigarettes. If you need help quitting, ask your health care provider. General instructions  Avoid any headache triggers. Keep a journal to help find out what may trigger your headaches. For example, write down: ? What you eat and drink. ? How much sleep you get. ? Any change to your diet or medicines.  Keep all follow-up visits. This is important.   Contact a health care provider if:  Your headache does not get better.  Your headache comes back.  You are sensitive to sounds, light, or smells because of a headache.  You have nausea or you vomit.  Your stomach hurts. Get help right away if:  You suddenly develop a severe headache, along with any of the following: ? A stiff neck. ? Nausea and vomiting. ? Confusion. ? Weakness in one part or one side of your body. ? Double vision or loss of vision. ? Shortness of breath. ? Rash. ? Unusual sleepiness. ? Fever or chills. ? Trouble speaking. ? Pain in your eye or ear. ? Trouble walking or balancing. ? Feeling faint or passing out. Summary  A tension headache is a feeling of pain, pressure, or aching over the front and sides of the head.  A tension headache can last from 30 minutes to several days. It is the most common kind of headache.  This condition may be diagnosed based on your symptoms, your medical history, and a physical exam.  This condition may be treated with lifestyle changes and with medicines that help relieve symptoms. This information is not intended to replace advice given to you by your health care provider. Make sure you discuss any questions you have with your health care provider. Document Revised: 01/09/2020 Document Reviewed: 01/09/2020 Elsevier  Patient Education  2021 [employer].

## 2020-09-10 ENCOUNTER — Encounter: Payer: Self-pay | Admitting: Family Medicine

## 2020-09-10 ENCOUNTER — Other Ambulatory Visit: Payer: Self-pay

## 2020-09-10 ENCOUNTER — Ambulatory Visit (INDEPENDENT_AMBULATORY_CARE_PROVIDER_SITE_OTHER): Payer: Managed Care, Other (non HMO) | Admitting: Family Medicine

## 2020-09-10 VITALS — BP 120/80 | HR 98 | Temp 98.1°F | Ht 64.5 in | Wt 178.5 lb

## 2020-09-10 DIAGNOSIS — Z975 Presence of (intrauterine) contraceptive device: Secondary | ICD-10-CM

## 2020-09-10 DIAGNOSIS — Z8659 Personal history of other mental and behavioral disorders: Secondary | ICD-10-CM | POA: Diagnosis not present

## 2020-09-10 DIAGNOSIS — M7061 Trochanteric bursitis, right hip: Secondary | ICD-10-CM | POA: Diagnosis not present

## 2020-09-10 DIAGNOSIS — Z0000 Encounter for general adult medical examination without abnormal findings: Secondary | ICD-10-CM | POA: Diagnosis not present

## 2020-09-10 DIAGNOSIS — J301 Allergic rhinitis due to pollen: Secondary | ICD-10-CM

## 2020-09-10 DIAGNOSIS — N951 Menopausal and female climacteric states: Secondary | ICD-10-CM

## 2020-09-10 DIAGNOSIS — Z1211 Encounter for screening for malignant neoplasm of colon: Secondary | ICD-10-CM | POA: Diagnosis not present

## 2020-09-10 DIAGNOSIS — Z1212 Encounter for screening for malignant neoplasm of rectum: Secondary | ICD-10-CM

## 2020-09-10 LAB — CBC WITH DIFFERENTIAL/PLATELET
Basophils Absolute: 0.1 10*3/uL (ref 0.0–0.1)
Basophils Relative: 1 % (ref 0.0–3.0)
Eosinophils Absolute: 0.2 10*3/uL (ref 0.0–0.7)
Eosinophils Relative: 2.6 % (ref 0.0–5.0)
HCT: 39.6 % (ref 36.0–46.0)
Hemoglobin: 13.3 g/dL (ref 12.0–15.0)
Lymphocytes Relative: 24.1 % (ref 12.0–46.0)
Lymphs Abs: 1.5 10*3/uL (ref 0.7–4.0)
MCHC: 33.7 g/dL (ref 30.0–36.0)
MCV: 87.4 fl (ref 78.0–100.0)
Monocytes Absolute: 0.4 10*3/uL (ref 0.1–1.0)
Monocytes Relative: 5.8 % (ref 3.0–12.0)
Neutro Abs: 4.3 10*3/uL (ref 1.4–7.7)
Neutrophils Relative %: 66.5 % (ref 43.0–77.0)
Platelets: 282 10*3/uL (ref 150.0–400.0)
RBC: 4.53 Mil/uL (ref 3.87–5.11)
RDW: 13.3 % (ref 11.5–15.5)
WBC: 6.4 10*3/uL (ref 4.0–10.5)

## 2020-09-10 LAB — COMPREHENSIVE METABOLIC PANEL
ALT: 12 U/L (ref 0–35)
AST: 16 U/L (ref 0–37)
Albumin: 4.6 g/dL (ref 3.5–5.2)
Alkaline Phosphatase: 128 U/L — ABNORMAL HIGH (ref 39–117)
BUN: 15 mg/dL (ref 6–23)
CO2: 28 mEq/L (ref 19–32)
Calcium: 9.6 mg/dL (ref 8.4–10.5)
Chloride: 105 mEq/L (ref 96–112)
Creatinine, Ser: 0.79 mg/dL (ref 0.40–1.20)
GFR: 87.71 mL/min (ref >60.00)
Glucose, Bld: 89 mg/dL (ref 70–99)
Potassium: 4.2 mEq/L (ref 3.5–5.1)
Sodium: 139 mEq/L (ref 135–145)
Total Bilirubin: 0.7 mg/dL (ref 0.2–1.2)
Total Protein: 7.6 g/dL (ref 6.0–8.3)

## 2020-09-10 LAB — LIPID PANEL
Cholesterol: 206 mg/dL — ABNORMAL HIGH (ref 0–200)
HDL: 51.5 mg/dL (ref >39.00)
LDL Cholesterol: 146 mg/dL — ABNORMAL HIGH (ref 0–99)
NonHDL: 154.17
Total CHOL/HDL Ratio: 4
Triglycerides: 41 mg/dL (ref 0.0–149.0)
VLDL: 8.2 mg/dL (ref 0.0–40.0)

## 2020-09-10 LAB — VITAMIN D 25 HYDROXY (VIT D DEFICIENCY, FRACTURES): VITD: 29.67 ng/mL — ABNORMAL LOW (ref 30.00–100.00)

## 2020-09-10 LAB — TSH: TSH: 0.89 u[IU]/mL (ref 0.35–4.50)

## 2020-09-10 MED ORDER — DICLOFENAC SODIUM 75 MG PO TBEC: 75.0000 mg | DELAYED_RELEASE_TABLET | Freq: Two times a day (BID) | ORAL | 0 refills | Status: DC

## 2020-09-10 NOTE — Patient Instructions (Signed)
Please return in 12 months for your annual complete physical; please come fasting. Sooner if needed for mood or hip pain.  I will release your lab results to you on your MyChart account with further instructions. Please reply with any questions.   We will call you with information regarding your referral appointment. Gastroenterology for colonoscopy.  If you do not hear from [place] within the next 2 weeks, please let me know. It can take 1-2 weeks to get appointments set up with the specialists.   If you have any questions or concerns, please don't hesitate to send me a message via MyChart or call the office at [phone]. Thank you for visiting with [place] today! It's our pleasure caring for you.   Hip Bursitis  Hip bursitis is the inflammation of one or more bursae in the hip joint. Bursae are small fluid-filled sacs that absorb shock and prevent bones from rubbing against each other. Hip bursitis can cause mild to moderate pain, and symptoms often come and go over time. What are the causes? This condition results from increased friction between the hip bones and the tendons around the hip joint. This condition can happen if you:  Overuse your hip muscles.  Injure your hip.  Have weak buttocks muscles.  Have bone spurs.  Have an infection. In some cases, the cause may not be known. What increases the risk? You are more likely to develop this condition if:  You injured your hip previously or had hip surgery.  You have a medical condition, such as arthritis, gout, diabetes, or thyroid disease.  You have spine problems.  You have one leg that is shorter than the other.  You participate in athletic activities that include repetitive motion, like running.  You participate in sports where there is a risk of injury or falling, such as football, martial arts, or skiing. What are the signs or symptoms? Symptoms may come and go, and they often include:  Pain in the hip or groin area.  Pain may get worse with movement.  Tenderness and swelling of the hip. In rare cases, the bursa may become infected. If this happens, you may get a fever, as well as warmth and redness in the hip area. How is this diagnosed? This condition may be diagnosed based on:  Your symptoms.  Your medical history.  A physical exam.  Imaging tests, such as: ? X-rays to check your bones. ? MRI or ultrasound to check your tendons and muscles. ? Bone scan.  A biopsy to remove fluid from your inflamed bursa for testing. How is this treated? This condition is treated by resting, icing, applying pressure (compression), and raising (elevating) the injured area. This is called RICE treatment. In some cases, RICE treatment may not be enough to make your symptoms go away. Treatment may also include:  Taking medicine to help with swelling and pain.  Using crutches, a cane, or a walker to decrease the strain on your hip.  Getting a shot of cortisone medicine to help reduce swelling.  Taking other medicines if the bursa is infected.  Draining fluid out of the bursa to help relieve swelling.  Having surgery to remove a damaged or infected bursa. This is rare. Long-term treatment may include:  Physical therapy exercises for strength and flexibility.  Lifestyle changes, such as weight loss, to reduce the strain on the hip. Follow these instructions at home: Managing pain, stiffness, and swelling  If directed, put ice on the painful area. ? Put  ice in a plastic bag. ? Place a towel between your skin and the bag. ? Leave the ice on for 20 minutes, 2-3 times a day.  Raise (elevate) your hip as much as you can without pain. To do this, put a pillow under your hips while you lie down.  If directed, apply heat to the affected area as often as told by your health care provider. Use the heat source that your health care provider recommends, such as a moist heat pack or a heating pad. ? Place a towel  between your skin and the heat source. ? Leave the heat on for 20-30 minutes. ? Remove the heat if your skin turns bright red. This is especially important if you are unable to feel pain, heat, or cold. You may have a greater risk of getting burned.      Activity  Do not use your hip to support your body weight until your health care provider says that you can. Use crutches, a cane, or a walker as told by your health care provider.  If the affected leg is one that you use to drive, ask your health care provider if it is safe to drive.  Rest and protect your hip as much as possible until your pain and swelling get better.  Return to your normal activities as told by your health care provider. Ask your health care provider what activities are safe for you.  Do exercises as told by your health care provider. General instructions  Take over-the-counter and prescription medicines only as told by your health care provider.  Gently massage and stretch your injured area as often as is comfortable.  Wear compression wraps only as told by your health care provider.  If one of your legs is shorter than the other, get fitted for a shoe insert or orthotic.  Maintain a healthy weight. Follow instructions from your health care provider for weight control. These may include dietary restrictions.  Keep all follow-up visits as told by your health care provider. This is important. How is this prevented?  Exercise regularly, as told by your health care provider.  Wear supportive footwear that is appropriate for your sport.  Warm up and stretch before being active. Cool down and stretch after being active.  Take breaks regularly from repetitive activity.  If an activity irritates your hip or causes pain, avoid the activity as much as possible.  Avoid sitting down for long periods at a time. Where to find more information  American [employer]: orthoinfo.aaos.org Contact a  health care provider if:  You have a fever.  You develop new symptoms.  You have trouble walking or doing everyday activities.  You have pain that gets worse or does not get better with medicine.  You develop red skin or a feeling of warmth in your hip area. Get help right away if:  You cannot move your hip.  You have severe pain.  You cannot control the muscles in your feet. Summary  Hip bursitis is the inflammation of one or more bursae in the hip joint. Bursae are small fluid-filled sacs that absorb shock and prevent bones from rubbing against each other.  Hip bursitis can cause hip or groin pain, and symptoms often come and go over time.  This condition is often treated by resting, icing, applying pressure (compression), and raising (elevating) the injured area. Other treatments may be needed. This information is not intended to replace advice given to you by  your health care provider. Make sure you discuss any questions you have with your health care provider. Document Revised: 02/11/2019 Document Reviewed: 12/18/2017 Elsevier Patient Education  2021 [employer].

## 2020-09-10 NOTE — Progress Notes (Signed)
Subjective  Chief Complaint  Patient presents with  . Annual Exam    Fasting    HPI: Megan Mills is a 50 y.o. female who presents to [facility] today for a Female Wellness Visit. She also has the concerns and/or needs as listed above in the chief complaint. These will be addressed in addition to the Health Maintenance Visit.   Wellness Visit: annual visit with health maintenance review and exam without Pap   Health maintenance: Sees GYN for annual female wellness.  Pap smear is up-to-date.  IUD in place.  Mammograms up-to-date.  Healthy lifestyle.  Exercising regularly.  Immunizations are up-to-date.  Due for colon cancer screening. Chronic disease f/u and/or acute problem visit: (deemed necessary to be done in addition to the wellness visit):  Complains of right hip pain ongoing for the last weeks to months.  Worse when trying to stand up out of a seated position or with climbing stairs.  Does not interfere with sleep.  Denies back pain, radicular symptoms, lower extremity weakness or bowel or bladder dysfunction.  No injury but she does exercise 6 days/week.  No abdominal pain.  No pelvic pain.  Perimenopausal symptoms: Noticing some hot flashes and irritability.  Sleep has been affected.  She will speak with her GYN about this.  History of depression: Mood has been slightly off but she thinks this may be related to perimenopausal symptoms.  Denies active depression.  Allergies are very active, started over-the-counter allergy medications.  Typical symptoms of rhinitis and sneezing some eye symptoms are present  Depression screen [facility] 2/9 09/10/2020 10/03/2019  Decreased Interest 0 0  Down, Depressed, Hopeless 0 1  PHQ - 2 Score 0 1    Assessment  1. Annual physical exam   2. IUD (intrauterine device) in place - Mirena   3. Screening for colorectal cancer   4. Greater trochanteric bursitis of right hip   5. Perimenopausal symptoms   6. History of  depression   7. Seasonal allergic rhinitis due to pollen      Plan  Female Wellness Visit:  Age appropriate Health Maintenance and Prevention measures were discussed with patient. Included topics are cancer screening recommendations, ways to keep healthy (see AVS) including dietary and exercise recommendations, regular eye and dental care, use of seat belts, and avoidance of moderate alcohol use and tobacco use.  Discussed: Rectal cancer screening options: Refer for colonoscopy  BMI: discussed patient's BMI and encouraged positive lifestyle modifications to help get to or maintain a target BMI.  HM needs and immunizations were addressed and ordered. See below for orders. See HM and immunization section for updates.  Up-to-date  Routine labs and screening tests ordered including cmp, cbc and lipids where appropriate.  Discussed recommendations regarding Vit D and calcium supplementation (see AVS)  Chronic disease management visit and/or acute problem visit:  Right hip pain: Most consistent with bursitis might be due to overuse.  Discussed icing stretching 2-week course of NSAIDs and follow-up if not improving.  Consider steroid injection.  Discussed stretching before and after exercise.  Perimenopausal symptoms: Education given.  Could be affecting sleep and mood.  Monitor.  Follow-up with GYN.  Monitor mood, if worsening, return for reevaluation.  Allergies: Recommend Zyrtec and Flonase to get under control.   Follow up: 12 months for complete physical Orders Placed This Encounter  Procedures  . CBC with Differential/Platelet  . Comprehensive metabolic panel  . Lipid panel  . TSH  .  VITAMIN D 25 Hydroxy (Vit-D Deficiency, Fractures)  . Ambulatory referral to Gastroenterology   Meds ordered this encounter  Medications  . diclofenac (VOLTAREN) 75 MG EC tablet    Sig: Take 1 tablet (75 mg total) by mouth 2 (two) times daily.    Dispense:  30 tablet    Refill:  0      Body  mass index is 30.17 kg/m. Wt Readings from Last 3 Encounters:  09/10/20 178 lb 8 oz (81 kg)  06/11/20 189 lb 6.4 oz (85.9 kg)  06/07/20 185 lb (83.9 kg)     Patient Active Problem List   Diagnosis Date Noted  . Allergic rhinitis due to pollen 10/03/2019  . Trichotillomania 10/03/2019  . IUD (intrauterine device) in place - Mirena 10/03/2019    2nd placed 11/2019 Dr. Helane Rima. Last pap 04/2018; of note HM is incorrect (pap was not repeated in 11/2019)   . Obesity (BMI 30-39.9) 10/03/2019   Health Maintenance  Topic Date Due  . COLONOSCOPY (Pts 45-36yrs Insurance coverage will need to be confirmed)  Never done  . INFLUENZA VACCINE  11/23/2020  . MAMMOGRAM  12/10/2021  . PAP SMEAR-Modifier  12/18/2022  . TETANUS/TDAP  04/22/2026  . COVID-19 Vaccine  Completed  . Hepatitis C Screening  Completed  . HIV Screening  Completed  . HPV VACCINES  Aged Out   Immunization History  Administered Date(s) Administered  . Influenza, Seasonal, Injecte, Preservative Fre 12/24/2012, 01/15/2014  . Influenza,inj,Quad PF,6+ Mos 01/24/2020  . PFIZER(Purple Top)SARS-COV-2 Vaccination 07/26/2019, 08/21/2019, 02/24/2020  . Tdap 04/22/2016   We updated and reviewed the patient's past history in detail and it is documented below. Allergies: Patient is allergic to penicillins. Past Medical History Patient  has a past medical history of Allergy, Chicken pox, Hay fever, and UTI (urinary tract infection). Past Surgical History Patient  has a past surgical history that includes Knee arthroscopy (Left). Family History: Patient family history includes Bipolar disorder in her mother; Depression in her mother; Hearing loss in her maternal grandmother; Heart attack in her paternal grandmother; Hypertension in her maternal grandmother and mother; Mental illness in her mother. Social History:  Patient  reports that she has never smoked. She has never used smokeless tobacco. She reports current alcohol use. She  reports that she does not use drugs.  Review of Systems: Constitutional: negative for fever or malaise Ophthalmic: negative for photophobia, double vision or loss of vision Cardiovascular: negative for chest pain, dyspnea on exertion, or new LE swelling Respiratory: negative for SOB or persistent cough Gastrointestinal: negative for abdominal pain, change in bowel habits or melena Genitourinary: negative for dysuria or gross hematuria, no abnormal uterine bleeding or disharge Musculoskeletal: negative for new gait disturbance or muscular weakness Integumentary: negative for new or persistent rashes, no breast lumps Neurological: negative for TIA or stroke symptoms Psychiatric: negative for SI or delusions Allergic/Immunologic: negative for hives  Patient Care Team    Relationship Specialty Notifications Start End  Leamon Arnt, MD PCP - General Family Medicine  10/03/19   Levy Sjogren, MD Referring Physician Dermatology  10/03/19   Dian Queen, MD Consulting Physician Obstetrics and Gynecology  09/10/20     Objective  Vitals: BP 120/80 (BP Location: Left Arm, Patient Position: Sitting, Cuff Size: Large)   Pulse 98   Temp 98.1 F (36.7 C) (Temporal)   Ht 5' 4.5" (1.638 m)   Wt 178 lb 8 oz (81 kg)   SpO2 98%   BMI 30.17 kg/m  General:  Well developed, well nourished, no acute distress  Psych:  Alert and orientedx3,normal mood and affect HEENT:  Normocephalic, atraumatic, non-icteric sclera,  supple neck without adenopathy, mass or thyromegaly Cardiovascular:  Normal S1, S2, RRR without gallop, rub or murmur Respiratory:  Good breath sounds bilaterally, CTAB with normal respiratory effort Gastrointestinal: normal bowel sounds, soft, non-tender, no noted masses. No HSM MSK: no deformities, contusions. Joints are without erythema or swelling.  Tenderness over the right greater trochanteric bursa, full range of motion of the hips.  Normal back exam Skin:  Warm, no  rashes or suspicious lesions noted Neurologic:    Mental status is normal. CN 2-11 are normal. Gross motor and sensory exams are normal. Normal gait. No tremor    Commons side effects, risks, benefits, and alternatives for medications and treatment plan prescribed today were discussed, and the patient expressed understanding of the given instructions. Patient is instructed to call or message via MyChart if he/she has any questions or concerns regarding our treatment plan. No barriers to understanding were identified. We discussed Red Flag symptoms and signs in detail. Patient expressed understanding regarding what to do in case of urgent or emergency type symptoms.   Medication list was reconciled, printed and provided to the patient in AVS. Patient instructions and summary information was reviewed with the patient as documented in the AVS. This note was prepared with assistance of Dragon voice recognition software. Occasional wrong-word or sound-a-like substitutions may have occurred due to the inherent limitations of voice recognition software  This visit occurred during the SARS-CoV-2 public health emergency.  Safety protocols were in place, including screening questions prior to the visit, additional usage of staff PPE, and extensive cleaning of exam room while observing appropriate contact time as indicated for disinfecting solutions.

## 2020-09-11 ENCOUNTER — Encounter: Payer: Self-pay | Admitting: Family Medicine

## 2020-10-05 ENCOUNTER — Other Ambulatory Visit: Payer: Self-pay | Admitting: Family Medicine

## 2020-10-05 DIAGNOSIS — Z1231 Encounter for screening mammogram for malignant neoplasm of breast: Secondary | ICD-10-CM

## 2020-12-09 ENCOUNTER — Other Ambulatory Visit: Payer: Self-pay

## 2020-12-09 ENCOUNTER — Ambulatory Visit (AMBULATORY_SURGERY_CENTER): Payer: Managed Care, Other (non HMO)

## 2020-12-09 VITALS — Ht 64.5 in | Wt 185.0 lb

## 2020-12-09 DIAGNOSIS — Z1211 Encounter for screening for malignant neoplasm of colon: Secondary | ICD-10-CM

## 2020-12-09 NOTE — Progress Notes (Signed)
Patient's pre-visit was done today over the phone with the patient due to COVID-19 pandemic. Name,DOB and address verified. Insurance verified. Patient denies any allergies to Eggs and Soy. Patient denies any problems with anesthesia/sedation. Patient denies taking diet pills or blood thinners. No home Oxygen. Packet of Prep instructions mailed to patient including a copy of a consent form-pt is aware. Patient understands to call us back with any questions or concerns. Patient is aware of our care-partner policy and 0000000 safety protocol.   EMMI education assigned to the patient for the procedure, sent to [facility].   The patient is COVID-19 vaccinated.

## 2020-12-11 ENCOUNTER — Other Ambulatory Visit: Payer: Self-pay

## 2020-12-11 ENCOUNTER — Ambulatory Visit
Admission: RE | Admit: 2020-12-11 | Discharge: 2020-12-11 | Disposition: A | Discharge status: Home / Self Care | Payer: Managed Care, Other (non HMO) | Source: Ambulatory Visit | Attending: Family Medicine | Admitting: Family Medicine

## 2020-12-11 DIAGNOSIS — Z1231 Encounter for screening mammogram for malignant neoplasm of breast: Secondary | ICD-10-CM

## 2020-12-18 ENCOUNTER — Encounter: Payer: Self-pay | Admitting: Gastroenterology

## 2020-12-23 ENCOUNTER — Encounter: Payer: Self-pay | Admitting: Gastroenterology

## 2020-12-23 ENCOUNTER — Other Ambulatory Visit: Payer: Self-pay

## 2020-12-23 ENCOUNTER — Ambulatory Visit (AMBULATORY_SURGERY_CENTER): Payer: Managed Care, Other (non HMO) | Admitting: Gastroenterology

## 2020-12-23 VITALS — BP 121/80 | HR 79 | Temp 98.4°F | Resp 17 | Ht 64.5 in | Wt 185.0 lb

## 2020-12-23 DIAGNOSIS — Z1211 Encounter for screening for malignant neoplasm of colon: Secondary | ICD-10-CM | POA: Diagnosis not present

## 2020-12-23 DIAGNOSIS — D122 Benign neoplasm of ascending colon: Secondary | ICD-10-CM

## 2020-12-23 DIAGNOSIS — K635 Polyp of colon: Secondary | ICD-10-CM | POA: Diagnosis not present

## 2020-12-23 MED ORDER — SODIUM CHLORIDE 0.9 % IV SOLN: 500.0000 mL | INTRAVENOUS | Status: DC

## 2020-12-23 NOTE — Progress Notes (Signed)
Called to room to assist during endoscopic procedure.  Patient ID and intended procedure confirmed with present staff. Received instructions for my participation in the procedure from the performing physician.  

## 2020-12-23 NOTE — Op Note (Signed)
[facility] Patient Name: Megan Mills Procedure Date: 12/23/2020 9:56 AM MRN: [id number] Endoscopist: Thornton Park MD, MD Age: 50 Referring MD:  Date of Birth: 10/17/1970 Gender: Female Account #: [id number] Procedure:                Colonoscopy Indications:              Screening for colorectal malignant neoplasm, This                            is the patient's first colonoscopy                           No known family history of colon cancer or polyps Medicines:                Monitored Anesthesia Care Procedure:                Pre-Anesthesia Assessment:                           - Prior to the procedure, a History and Physical                            was performed, and patient medications and                            allergies were reviewed. The patient's tolerance of                            previous anesthesia was also reviewed. The risks                            and benefits of the procedure and the sedation                            options and risks were discussed with the patient.                            All questions were answered, and informed consent                            was obtained. Prior Anticoagulants: The patient has                            taken no previous anticoagulant or antiplatelet                            agents. ASA Grade Assessment: II - A patient with                            mild systemic disease. After reviewing the risks                            and benefits, the patient was deemed in  satisfactory condition to undergo the procedure.                           After obtaining informed consent, the colonoscope                            was passed under direct vision. Throughout the                            procedure, the patient's blood pressure, pulse, and                            oxygen saturations were monitored continuously. The                            CF [id number]  was introduced through the anus                            and advanced to the 4 cm into the ileum. A second                            forward view of the right colon was performed. The                            colonoscopy was performed without difficulty. The                            patient tolerated the procedure well. The quality                            of the bowel preparation was good. The terminal                            ileum, ileocecal valve, appendiceal orifice, and                            rectum were photographed. Scope In: 10:07:20 AM Scope Out: 10:21:58 AM Scope Withdrawal Time: 0 hours 10 minutes 18 seconds  Total Procedure Duration: 0 hours 14 minutes 38 seconds  Findings:                 The perianal and digital rectal examinations were                            normal.                           A diffuse area of mildly melanotic mucosa was found                            in the entire colon.                           Two flat polyps were found in the ascending colon.  The polyps were 1 mm in size. These polyps were                            removed with a cold biopsy forceps. Resection and                            retrieval were complete. Estimated blood loss was                            minimal.                           The exam was otherwise without abnormality on                            direct and retroflexion views. Complications:            No immediate complications. Estimated blood loss:                            Minimal. Estimated Blood Loss:     Estimated blood loss was minimal. Impression:               - Melanosis coli.                           - Two 1 mm polyps in the ascending colon, removed                            with a cold biopsy forceps. Resected and retrieved.                           - The examination was otherwise normal on direct                            and retroflexion  views. Recommendation:           - Patient has a contact number available for                            emergencies. The signs and symptoms of potential                            delayed complications were discussed with the                            patient. Return to normal activities tomorrow.                            Written discharge instructions were provided to the                            patient.                           - Resume previous diet.                           -  Continue present medications.                           - Await pathology results.                           - Repeat colonoscopy date to be determined after                            pending pathology results are reviewed for                            surveillance.                           - Emerging evidence supports eating a diet of                            fruits, vegetables, grains, calcium, and yogurt                            while reducing red meat and alcohol may reduce the                            risk of colon cancer.                           - Thank you for allowing me to be involved in your                            colon cancer prevention. Thornton Park MD, MD 12/23/2020 10:28:26 AM This report has been signed electronically.

## 2020-12-23 NOTE — Progress Notes (Signed)
Pt's states no medical or surgical changes since previsit or office visit. 

## 2020-12-23 NOTE — Progress Notes (Signed)
PT taken to PACU. Monitors in place. VSS. Report given to RN. 

## 2020-12-23 NOTE — Progress Notes (Signed)
   Referring Provider: Leamon Arnt, MD Primary Care Physician:  Leamon Arnt, MD  Reason for Procedure:  Colon cancer screening   IMPRESSION:  Need for colon cancer screening  PLAN: Colonoscopy in the [name] today   HPI: Megan Mills is a 50 y.o. female presents for screening colonoscopy.  No prior colonoscopy or colon cancer screening.  No baseline GI symptoms.   No known family history of colon cancer or polyps. No family history of uterine/endometrial cancer, pancreatic cancer or gastric/stomach cancer.   Past Medical History:  Diagnosis Date   Allergy    Chicken pox    Hay fever    UTI (urinary tract infection)     Past Surgical History:  Procedure Laterality Date   KNEE ARTHROSCOPY Left     Current Outpatient Medications  Medication Sig Dispense Refill   Calcium Carbonate-Vitamin D (CALCIUM PLUS VITAMIN D PO) Take 2 each by mouth daily. Calcium 500 mg and Vit D 700 IU     COLLAGEN PO Take by mouth.     levonorgestrel (MIRENA) 20 MCG/24HR IUD 1 each by Intrauterine route once. Every 5 years     diclofenac (VOLTAREN) 75 MG EC tablet Take 1 tablet (75 mg total) by mouth 2 (two) times daily. (Patient not taking: No sig reported) 30 tablet 0   fluticasone (FLONASE) 50 MCG/ACT nasal spray Place into both nostrils as needed for allergies or rhinitis.     loratadine (CLARITIN) 10 MG tablet Take 10 mg by mouth daily as needed for allergies.     Current Facility-Administered Medications  Medication Dose Route Frequency Provider Last Rate Last Admin   0.9 %  sodium chloride infusion  500 mL Intravenous Continuous Thornton Park, MD        Allergies as of 12/23/2020 - Review Complete 12/23/2020  Allergen Reaction Noted   Penicillins Other (See Comments) 03/30/2015    Family History  Problem Relation Age of Onset   Bipolar disorder Mother    Depression Mother    Mental illness Mother    Hypertension Mother    Hearing loss Maternal Grandmother     Hypertension Maternal Grandmother    Heart attack Paternal Grandmother    Colon cancer Neg Hx    Colon polyps Neg Hx    Esophageal cancer Neg Hx    Rectal cancer Neg Hx    Stomach cancer Neg Hx      Physical Exam: General:   Alert,  well-nourished, pleasant and cooperative in NAD Head:  Normocephalic and atraumatic. Eyes:  Sclera clear, no icterus.   Conjunctiva pink. Mouth:  No deformity or lesions.   Neck:  Supple; no masses or thyromegaly. Lungs:  Clear throughout to auscultation.   No wheezes. Heart:  Regular rate and rhythm; no murmurs. Abdomen:  Soft, non-tender, nondistended, normal bowel sounds, no rebound or guarding.  Msk:  Symmetrical. No boney deformities LAD: No inguinal or umbilical LAD Extremities:  No clubbing or edema. Neurologic:  Alert and  oriented x4;  grossly nonfocal Skin:  No obvious rash or bruise. Psych:  Alert and cooperative. Normal mood and affect.     Megan Celestin L. Tarri Glenn, MD, MPH 12/23/2020, 9:57 AM

## 2020-12-23 NOTE — Patient Instructions (Signed)
Resume previous diet and medications. Awaiting pathology results. Repeat colonoscopy date to be determined based on pathology results.  YOU HAD AN ENDOSCOPIC PROCEDURE TODAY AT [facility]:   Refer to the procedure report that was given to you for any specific questions about what was found during the examination.  If the procedure report does not answer your questions, please call your gastroenterologist to clarify.  If you requested that your care partner not be given the details of your procedure findings, then the procedure report has been included in a sealed envelope for you to review at your convenience later.  YOU SHOULD EXPECT: Some feelings of bloating in the abdomen. Passage of more gas than usual.  Walking can help get rid of the air that was put into your GI tract during the procedure and reduce the bloating. If you had a lower endoscopy (such as a colonoscopy or flexible sigmoidoscopy) you may notice spotting of blood in your stool or on the toilet paper. If you underwent a bowel prep for your procedure, you may not have a normal bowel movement for a few days.  Please Note:  You might notice some irritation and congestion in your nose or some drainage.  This is from the oxygen used during your procedure.  There is no need for concern and it should clear up in a day or so.  SYMPTOMS TO REPORT IMMEDIATELY:  Following lower endoscopy (colonoscopy or flexible sigmoidoscopy):  Excessive amounts of blood in the stool  Significant tenderness or worsening of abdominal pains  Swelling of the abdomen that is new, acute  Fever of 100F or higher  For urgent or emergent issues, a gastroenterologist can be reached at any hour by calling [phone]. Do not use MyChart messaging for urgent concerns.    DIET:  We do recommend a small meal at first, but then you may proceed to your regular diet.  Drink plenty of fluids but you should avoid alcoholic beverages for 24  hours.  ACTIVITY:  You should plan to take it easy for the rest of today and you should NOT DRIVE or use heavy machinery until tomorrow (because of the sedation medicines used during the test).    FOLLOW UP: Our staff will call the number listed on your records 48-72 hours following your procedure to check on you and address any questions or concerns that you may have regarding the information given to you following your procedure. If we do not reach you, we will leave a message.  We will attempt to reach you two times.  During this call, we will ask if you have developed any symptoms of COVID 19. If you develop any symptoms (ie: fever, flu-like symptoms, shortness of breath, cough etc.) before then, please call [phone].  If you test positive for Covid 19 in the 2 weeks post procedure, please call and report this information to [place].    If any biopsies were taken you will be contacted by phone or by letter within the next 1-3 weeks.  Please call us at [phone] if you have not heard about the biopsies in 3 weeks.    SIGNATURES/CONFIDENTIALITY: You and/or your care partner have signed paperwork which will be entered into your electronic medical record.  These signatures attest to the fact that that the information above on your After Visit Summary has been reviewed and is understood.  Full responsibility of the confidentiality of this discharge information lies with you and/or your care-partner.

## 2020-12-25 ENCOUNTER — Telehealth: Payer: Self-pay | Admitting: *Deleted

## 2020-12-25 NOTE — Telephone Encounter (Signed)
  Follow up Call-  Call back number 12/23/2020  Post procedure Call Back phone  # [phone]  Permission to leave phone message Yes     Patient questions:  Do you have a fever, pain , or abdominal swelling? No. Pain Score  0 *  Have you tolerated food without any problems? Yes.    Have you been able to return to your normal activities? Yes.    Do you have any questions about your discharge instructions: Diet   No. Medications  No. Follow up visit  No.  Do you have questions or concerns about your Care? No.  Actions: * If pain score is 4 or above: No action needed, pain <4.  Have you developed a fever since your procedure? no  2.   Have you had an respiratory symptoms (SOB or cough) since your procedure? no  3.   Have you tested positive for COVID 19 since your procedure no  4.   Have you had any family members/close contacts diagnosed with the COVID 19 since your procedure?  no   If yes to any of these questions please route to Joylene John, RN and Joella Prince, RN

## 2020-12-29 ENCOUNTER — Encounter: Payer: Self-pay | Admitting: Gastroenterology

## 2021-09-16 ENCOUNTER — Encounter: Payer: Self-pay | Admitting: Family Medicine

## 2021-09-16 ENCOUNTER — Ambulatory Visit (INDEPENDENT_AMBULATORY_CARE_PROVIDER_SITE_OTHER): Payer: Managed Care, Other (non HMO) | Admitting: Family Medicine

## 2021-09-16 VITALS — BP 110/82 | HR 100 | Temp 98.5°F | Ht 64.5 in | Wt 196.0 lb

## 2021-09-16 DIAGNOSIS — Z23 Encounter for immunization: Secondary | ICD-10-CM

## 2021-09-16 DIAGNOSIS — K635 Polyp of colon: Secondary | ICD-10-CM | POA: Diagnosis not present

## 2021-09-16 DIAGNOSIS — Z0000 Encounter for general adult medical examination without abnormal findings: Secondary | ICD-10-CM | POA: Diagnosis not present

## 2021-09-16 DIAGNOSIS — D126 Benign neoplasm of colon, unspecified: Secondary | ICD-10-CM | POA: Insufficient documentation

## 2021-09-16 NOTE — Progress Notes (Signed)
She.  Patient is following the Subjective  Chief Complaint  Patient presents with   Annual Exam    Pt here for Annual exam and is not currently fasting    HPI: Megan Mills is a 51 y.o. female who presents to [facility] [place] today for a Female Wellness Visit. She also has the concerns and/or needs as listed above in the chief complaint. These will be addressed in addition to the Health Maintenance Visit.   Wellness Visit: annual visit with health maintenance review and exam without Pap  Health maintenance: Had annual GYN visit in November.  Normal Pap smear.  Mammogram is current from August.  Gets at [place].  Eligible for Shingrix.  Colon polyps on recent colonoscopy.  Scheduled for 5-year follow-up.  Active lifestyle.  Happy and healthy.  Assessment  1. Annual physical exam   2. Need for shingles vaccine   3. Polyp of colon, unspecified part of colon, unspecified type      Plan  Female Wellness Visit: Age appropriate Health Maintenance and Prevention measures were discussed with patient. Included topics are cancer screening recommendations, ways to keep healthy (see AVS) including dietary and exercise recommendations, regular eye and dental care, use of seat belts, and avoidance of moderate alcohol use and tobacco use.  BMI: discussed patient's BMI and encouraged positive lifestyle modifications to help get to or maintain a target BMI. HM needs and immunizations were addressed and ordered. See below for orders. See HM and immunization section for updates.  She will return in 6 months for her second Shingrix vaccination.  First was given today Routine labs and screening tests ordered including cmp, cbc and lipids where appropriate.  She will return for fasting labs Discussed recommendations regarding Vit D and calcium supplementation (see AVS)   Follow up: 12 months for complete physical Orders Placed This Encounter  Procedures    Varicella-zoster vaccine IM (Shingrix)   CBC with Differential/Platelet   Comp Met (CMET)   Lipid Profile   No orders of the defined types were placed in this encounter.     Body mass index is 33.12 kg/m. Wt Readings from Last 3 Encounters:  09/16/21 196 lb (88.9 kg)  12/23/20 185 lb (83.9 kg)  12/09/20 185 lb (83.9 kg)     Patient Active Problem List   Diagnosis Date Noted   Polyp of colon 09/16/2021   Allergic rhinitis due to pollen 10/03/2019   Trichotillomania 10/03/2019   IUD (intrauterine device) in place - Mirena 10/03/2019    2nd placed 11/2019 Dr. Helane Rima. Last pap 04/2018; of note HM is incorrect (pap was not repeated in 11/2019)     Obesity (BMI 30-39.9) 10/03/2019   Health Maintenance  Topic Date Due   COVID-19 Vaccine (4 - Booster for [place] series) 10/02/2021 (Originally 04/20/2020)   Zoster Vaccines- Shingrix (2 of 2) 11/11/2021   INFLUENZA VACCINE  11/23/2021   MAMMOGRAM  12/11/2021   COLONOSCOPY (Pts 45-13yr Insurance coverage will need to be confirmed)  12/23/2025   PAP SMEAR-Modifier  02/23/2026   TETANUS/TDAP  04/22/2026   Hepatitis C Screening  Completed   HIV Screening  Completed   HPV VACCINES  Aged Out   Immunization History  Administered Date(s) Administered   Influenza, Seasonal, Injecte, Preservative Fre 12/24/2012, 01/15/2014   Influenza,inj,Quad PF,6+ Mos 01/24/2020   PFIZER(Purple Top)SARS-COV-2 Vaccination 07/26/2019, 08/21/2019, 02/24/2020   Tdap 04/22/2016   Zoster Recombinat (Shingrix) 09/16/2021   We updated and reviewed the patient's  past history in detail and it is documented below. Allergies: Patient is allergic to penicillins. Past Medical History Patient  has a past medical history of Allergy, Chicken pox, Hay fever, and UTI (urinary tract infection). Past Surgical History Patient  has a past surgical history that includes Knee arthroscopy (Left). Family History: Patient family history includes Bipolar disorder in her  mother; Depression in her mother; Hearing loss in her maternal grandmother; Heart attack in her paternal grandmother; Hypertension in her maternal grandmother and mother; Mental illness in her mother. Social History:  Patient  reports that she has never smoked. She has never used smokeless tobacco. She reports that she does not currently use alcohol. She reports that she does not use drugs.  Review of Systems: Constitutional: negative for fever or malaise Ophthalmic: negative for photophobia, double vision or loss of vision Cardiovascular: negative for chest pain, dyspnea on exertion, or new LE swelling Respiratory: negative for SOB or persistent cough Gastrointestinal: negative for abdominal pain, change in bowel habits or melena Genitourinary: negative for dysuria or gross hematuria, no abnormal uterine bleeding or disharge Musculoskeletal: negative for new gait disturbance or muscular weakness Integumentary: negative for new or persistent rashes, no breast lumps Neurological: negative for TIA or stroke symptoms Psychiatric: negative for SI or delusions Allergic/Immunologic: negative for hives  Patient Care Team    Relationship Specialty Notifications Start End  Leamon Arnt, MD PCP - General Family Medicine  10/03/19   Levy Sjogren, MD Referring Physician Dermatology  10/03/19   Dian Queen, MD Consulting Physician Obstetrics and Gynecology  09/10/20     Objective  Vitals: BP 110/82   Pulse 100   Temp 98.5 F (36.9 C)   Ht 5' 4.5" (1.638 m)   Wt 196 lb (88.9 kg)   SpO2 96%   BMI 33.12 kg/m  General:  Well developed, well nourished, no acute distress  Psych:  Alert and orientedx3,normal mood and affect HEENT:  Normocephalic, atraumatic, non-icteric sclera,  supple neck without adenopathy, mass or thyromegaly Cardiovascular:  Normal S1, S2, RRR without gallop, rub or murmur Respiratory:  Good breath sounds bilaterally, CTAB with normal respiratory  effort Gastrointestinal: normal bowel sounds, soft, non-tender, no noted masses. No HSM MSK: no deformities, contusions. Joints are without erythema or swelling.  Skin:  Warm, no rashes or suspicious lesions noted Neurologic:    Mental status is normal. CN 2-11 are normal. Gross motor and sensory exams are normal. Normal gait. No tremor .   Commons side effects, risks, benefits, and alternatives for medications and treatment plan prescribed today were discussed, and the patient expressed understanding of the given instructions. Patient is instructed to call or message via MyChart if he/she has any questions or concerns regarding our treatment plan. No barriers to understanding were identified. We discussed Red Flag symptoms and signs in detail. Patient expressed understanding regarding what to do in case of urgent or emergency type symptoms.  Medication list was reconciled, printed and provided to the patient in AVS. Patient instructions and summary information was reviewed with the patient as documented in the AVS. This note was prepared with assistance of Dragon voice recognition software. Occasional wrong-word or sound-a-like substitutions may have occurred due to the inherent limitations of voice recognition software  This visit occurred during the SARS-CoV-2 public health emergency.  Safety protocols were in place, including screening questions prior to the visit, additional usage of staff PPE, and extensive cleaning of exam room while observing appropriate contact time as indicated for  disinfecting solutions.

## 2021-09-16 NOTE — Patient Instructions (Signed)
Please return in 12 months for your annual complete physical; please come fasting.  Please schedule a lab visit for your fasting labs.  Please schedule a nurse visit in 2-6 months for your 2nd shingrix vaccination. You were given your first today.   I will release your lab results to you on your MyChart account with further instructions. You may see the results before I do, but when I review them I will send you a message with my report or have my assistant call you if things need to be discussed. Please reply to my message with any questions. Thank you!   If you have any questions or concerns, please don't hesitate to send me a message via MyChart or call the office at [phone]. Thank you for visiting with [place] today! It's our pleasure caring for you.   Preventive Care 4-68 Years Old, Female Preventive care refers to lifestyle choices and visits with your health care provider that can promote health and wellness. Preventive care visits are also called wellness exams. What can I expect for my preventive care visit? Counseling Your health care provider may ask you questions about your: Medical history, including: Past medical problems. Family medical history. Pregnancy history. Current health, including: Menstrual cycle. Method of birth control. Emotional well-being. Home life and relationship well-being. Sexual activity and sexual health. Lifestyle, including: Alcohol, nicotine or tobacco, and drug use. Access to firearms. Diet, exercise, and sleep habits. Work and work Statistician. Sunscreen use. Safety issues such as seatbelt and bike helmet use. Physical exam Your health care provider will check your: Height and weight. These may be used to calculate your BMI (body mass index). BMI is a measurement that tells if you are at a healthy weight. Waist circumference. This measures the distance around your waistline. This measurement also tells if you are at a healthy weight and may  help predict your risk of certain diseases, such as type 2 diabetes and high blood pressure. Heart rate and blood pressure. Body temperature. Skin for abnormal spots. What immunizations do I need?  Vaccines are usually given at various ages, according to a schedule. Your health care provider will recommend vaccines for you based on your age, medical history, and lifestyle or other factors, such as travel or where you work. What tests do I need? Screening Your health care provider may recommend screening tests for certain conditions. This may include: Lipid and cholesterol levels. Diabetes screening. This is done by checking your blood sugar (glucose) after you have not eaten for a while (fasting). Pelvic exam and Pap test. Hepatitis B test. Hepatitis C test. HIV (human immunodeficiency virus) test. STI (sexually transmitted infection) testing, if you are at risk. Lung cancer screening. Colorectal cancer screening. Mammogram. Talk with your health care provider about when you should start having regular mammograms. This may depend on whether you have a family history of breast cancer. BRCA-related cancer screening. This may be done if you have a family history of breast, ovarian, tubal, or peritoneal cancers. Bone density scan. This is done to screen for osteoporosis. Talk with your health care provider about your test results, treatment options, and if necessary, the need for more tests. Follow these instructions at home: Eating and drinking  Eat a diet that includes fresh fruits and vegetables, whole grains, lean protein, and low-fat dairy products. Take vitamin and mineral supplements as recommended by your health care provider. Do not drink alcohol if: Your health care provider tells you not to drink. You are pregnant,  may be pregnant, or are planning to become pregnant. If you drink alcohol: Limit how much you have to 0-1 drink a day. Know how much alcohol is in your drink. In  the [place] one drink equals one 12 oz bottle of beer (355 mL), one 5 oz glass of wine (148 mL), or one 1 oz glass of hard liquor (44 mL). Lifestyle Brush your teeth every morning and night with fluoride toothpaste. Floss one time each day. Exercise for at least 30 minutes 5 or more days each week. Do not use any products that contain nicotine or tobacco. These products include cigarettes, chewing tobacco, and vaping devices, such as e-cigarettes. If you need help quitting, ask your health care provider. Do not use drugs. If you are sexually active, practice safe sex. Use a condom or other form of protection to prevent STIs. If you do not wish to become pregnant, use a form of birth control. If you plan to become pregnant, see your health care provider for a prepregnancy visit. Take aspirin only as told by your health care provider. Make sure that you understand how much to take and what form to take. Work with your health care provider to find out whether it is safe and beneficial for you to take aspirin daily. Find healthy ways to manage stress, such as: Meditation, yoga, or listening to music. Journaling. Talking to a trusted person. Spending time with friends and family. Minimize exposure to UV radiation to reduce your risk of skin cancer. Safety Always wear your seat belt while driving or riding in a vehicle. Do not drive: If you have been drinking alcohol. Do not ride with someone who has been drinking. When you are tired or distracted. While texting. If you have been using any mind-altering substances or drugs. Wear a helmet and other protective equipment during sports activities. If you have firearms in your house, make sure you follow all gun safety procedures. Seek help if you have been physically or sexually abused. What's next? Visit your health care provider once a year for an annual wellness visit. Ask your health care provider how often you should have your eyes and teeth  checked. Stay up to date on all vaccines. This information is not intended to replace advice given to you by your health care provider. Make sure you discuss any questions you have with your health care provider. Document Revised: 10/07/2020 Document Reviewed: 10/07/2020 Elsevier Patient Education  [place].

## 2021-09-23 ENCOUNTER — Other Ambulatory Visit: Payer: Managed Care, Other (non HMO)

## 2021-10-07 ENCOUNTER — Other Ambulatory Visit (INDEPENDENT_AMBULATORY_CARE_PROVIDER_SITE_OTHER): Payer: Managed Care, Other (non HMO)

## 2021-10-07 DIAGNOSIS — Z0000 Encounter for general adult medical examination without abnormal findings: Secondary | ICD-10-CM | POA: Diagnosis not present

## 2021-10-07 LAB — CBC WITH DIFFERENTIAL/PLATELET
Basophils Absolute: 0.1 10*3/uL (ref 0.0–0.1)
Basophils Relative: 1.1 % (ref 0.0–3.0)
Eosinophils Absolute: 0.2 10*3/uL (ref 0.0–0.7)
Eosinophils Relative: 3.4 % (ref 0.0–5.0)
HCT: 37.6 % (ref 36.0–46.0)
Hemoglobin: 12.5 g/dL (ref 12.0–15.0)
Lymphocytes Relative: 27.4 % (ref 12.0–46.0)
Lymphs Abs: 1.5 10*3/uL (ref 0.7–4.0)
MCHC: 33.2 g/dL (ref 30.0–36.0)
MCV: 88.3 fl (ref 78.0–100.0)
Monocytes Absolute: 0.4 10*3/uL (ref 0.1–1.0)
Monocytes Relative: 6.5 % (ref 3.0–12.0)
Neutro Abs: 3.4 10*3/uL (ref 1.4–7.7)
Neutrophils Relative %: 61.6 % (ref 43.0–77.0)
Platelets: 259 10*3/uL (ref 150.0–400.0)
RBC: 4.26 Mil/uL (ref 3.87–5.11)
RDW: 13.1 % (ref 11.5–15.5)
WBC: 5.5 10*3/uL (ref 4.0–10.5)

## 2021-10-07 LAB — COMPREHENSIVE METABOLIC PANEL
ALT: 11 U/L (ref 0–35)
AST: 15 U/L (ref 0–37)
Albumin: 4.4 g/dL (ref 3.5–5.2)
Alkaline Phosphatase: 109 U/L (ref 39–117)
BUN: 14 mg/dL (ref 6–23)
CO2: 27 mEq/L (ref 19–32)
Calcium: 9.3 mg/dL (ref 8.4–10.5)
Chloride: 107 mEq/L (ref 96–112)
Creatinine, Ser: 0.78 mg/dL (ref 0.40–1.20)
GFR: 88.39 mL/min (ref >60.00)
Glucose, Bld: 94 mg/dL (ref 70–99)
Potassium: 4.1 mEq/L (ref 3.5–5.1)
Sodium: 141 mEq/L (ref 135–145)
Total Bilirubin: 0.6 mg/dL (ref 0.2–1.2)
Total Protein: 7.2 g/dL (ref 6.0–8.3)

## 2021-10-07 LAB — LIPID PANEL
Cholesterol: 171 mg/dL (ref 0–200)
HDL: 43.3 mg/dL (ref >39.00)
LDL Cholesterol: 119 mg/dL — ABNORMAL HIGH (ref 0–99)
NonHDL: 127.2
Total CHOL/HDL Ratio: 4
Triglycerides: 43 mg/dL (ref 0.0–149.0)
VLDL: 8.6 mg/dL (ref 0.0–40.0)

## 2021-10-11 ENCOUNTER — Other Ambulatory Visit: Payer: Self-pay | Admitting: Family Medicine

## 2021-10-11 DIAGNOSIS — Z1231 Encounter for screening mammogram for malignant neoplasm of breast: Secondary | ICD-10-CM

## 2021-11-16 ENCOUNTER — Ambulatory Visit (INDEPENDENT_AMBULATORY_CARE_PROVIDER_SITE_OTHER): Payer: Managed Care, Other (non HMO)

## 2021-11-16 DIAGNOSIS — Z23 Encounter for immunization: Secondary | ICD-10-CM | POA: Diagnosis not present

## 2021-11-26 ENCOUNTER — Telehealth: Payer: Self-pay | Admitting: Family Medicine

## 2021-11-26 ENCOUNTER — Other Ambulatory Visit: Payer: Self-pay

## 2021-11-26 ENCOUNTER — Emergency Department (HOSPITAL_BASED_OUTPATIENT_CLINIC_OR_DEPARTMENT_OTHER)
Admission: EM | Admit: 2021-11-26 | Discharge: 2021-11-26 | Disposition: A | Discharge status: Home / Self Care | Payer: Managed Care, Other (non HMO) | Attending: Emergency Medicine | Admitting: Emergency Medicine

## 2021-11-26 ENCOUNTER — Emergency Department (HOSPITAL_BASED_OUTPATIENT_CLINIC_OR_DEPARTMENT_OTHER): Payer: Managed Care, Other (non HMO) | Admitting: Radiology

## 2021-11-26 ENCOUNTER — Emergency Department (HOSPITAL_BASED_OUTPATIENT_CLINIC_OR_DEPARTMENT_OTHER): Payer: Managed Care, Other (non HMO)

## 2021-11-26 DIAGNOSIS — R200 Anesthesia of skin: Secondary | ICD-10-CM | POA: Diagnosis not present

## 2021-11-26 DIAGNOSIS — R8289 Other abnormal findings on cytological and histological examination of urine: Secondary | ICD-10-CM | POA: Insufficient documentation

## 2021-11-26 DIAGNOSIS — Z20822 Contact with and (suspected) exposure to covid-19: Secondary | ICD-10-CM | POA: Insufficient documentation

## 2021-11-26 DIAGNOSIS — R202 Paresthesia of skin: Secondary | ICD-10-CM | POA: Insufficient documentation

## 2021-11-26 DIAGNOSIS — R1013 Epigastric pain: Secondary | ICD-10-CM | POA: Diagnosis not present

## 2021-11-26 DIAGNOSIS — R0789 Other chest pain: Secondary | ICD-10-CM | POA: Insufficient documentation

## 2021-11-26 DIAGNOSIS — R079 Chest pain, unspecified: Secondary | ICD-10-CM

## 2021-11-26 LAB — URINALYSIS, ROUTINE W REFLEX MICROSCOPIC
Bilirubin Urine: NEGATIVE
Glucose, UA: NEGATIVE mg/dL
Ketones, ur: NEGATIVE mg/dL
Nitrite: NEGATIVE
Protein, ur: NEGATIVE mg/dL
Specific Gravity, Urine: <1.005 — ABNORMAL LOW (ref 1.005–1.030)
pH: 5.5 (ref 5.0–8.0)

## 2021-11-26 LAB — COMPREHENSIVE METABOLIC PANEL
ALT: 11 U/L (ref 0–44)
AST: 16 U/L (ref 15–41)
Albumin: 4.7 g/dL (ref 3.5–5.0)
Alkaline Phosphatase: 114 U/L (ref 38–126)
Anion gap: 10 (ref 5–15)
BUN: 17 mg/dL (ref 6–20)
CO2: 25 mmol/L (ref 22–32)
Calcium: 9.4 mg/dL (ref 8.9–10.3)
Chloride: 104 mmol/L (ref 98–111)
Creatinine, Ser: 0.87 mg/dL (ref 0.44–1.00)
GFR, Estimated: >60 mL/min (ref >60)
Glucose, Bld: 106 mg/dL — ABNORMAL HIGH (ref 70–99)
Potassium: 3.9 mmol/L (ref 3.5–5.1)
Sodium: 139 mmol/L (ref 135–145)
Total Bilirubin: 0.3 mg/dL (ref 0.3–1.2)
Total Protein: 7.8 g/dL (ref 6.5–8.1)

## 2021-11-26 LAB — DIFFERENTIAL
Abs Immature Granulocytes: 0.05 10*3/uL (ref 0.00–0.07)
Basophils Absolute: 0.1 10*3/uL (ref 0.0–0.1)
Basophils Relative: 1 %
Eosinophils Absolute: 0.2 10*3/uL (ref 0.0–0.5)
Eosinophils Relative: 2 %
Immature Granulocytes: 1 %
Lymphocytes Relative: 18 %
Lymphs Abs: 1.7 10*3/uL (ref 0.7–4.0)
Monocytes Absolute: 0.6 10*3/uL (ref 0.1–1.0)
Monocytes Relative: 6 %
Neutro Abs: 6.7 10*3/uL (ref 1.7–7.7)
Neutrophils Relative %: 72 %

## 2021-11-26 LAB — LIPASE, BLOOD: Lipase: 20 U/L (ref 11–51)

## 2021-11-26 LAB — ETHANOL: Alcohol, Ethyl (B): <10 mg/dL (ref <10)

## 2021-11-26 LAB — CBC
HCT: 38.4 % (ref 36.0–46.0)
Hemoglobin: 12.7 g/dL (ref 12.0–15.0)
MCH: 29.1 pg (ref 26.0–34.0)
MCHC: 33.1 g/dL (ref 30.0–36.0)
MCV: 87.9 fL (ref 80.0–100.0)
Platelets: 292 10*3/uL (ref 150–400)
RBC: 4.37 MIL/uL (ref 3.87–5.11)
RDW: 12.6 % (ref 11.5–15.5)
WBC: 9.3 10*3/uL (ref 4.0–10.5)
nRBC: 0 % (ref 0.0–0.2)

## 2021-11-26 LAB — APTT: aPTT: 34 seconds (ref 24–36)

## 2021-11-26 LAB — RESP PANEL BY RT-PCR (FLU A&B, COVID) ARPGX2
Influenza A by PCR: NEGATIVE
Influenza B by PCR: NEGATIVE
SARS Coronavirus 2 by RT PCR: NEGATIVE

## 2021-11-26 LAB — PROTIME-INR
INR: 1.1 (ref 0.8–1.2)
Prothrombin Time: 14 seconds (ref 11.4–15.2)

## 2021-11-26 LAB — RAPID URINE DRUG SCREEN, HOSP PERFORMED
Amphetamines: NOT DETECTED
Barbiturates: NOT DETECTED
Benzodiazepines: NOT DETECTED
Cocaine: NOT DETECTED
Opiates: NOT DETECTED
Tetrahydrocannabinol: NOT DETECTED

## 2021-11-26 LAB — PREGNANCY, URINE: Preg Test, Ur: NEGATIVE

## 2021-11-26 LAB — TROPONIN I (HIGH SENSITIVITY)
Troponin I (High Sensitivity): <2 ng/L (ref <18)
Troponin I (High Sensitivity): <2 ng/L (ref <18)

## 2021-11-26 LAB — CBG MONITORING, ED: Glucose-Capillary: 103 mg/dL — ABNORMAL HIGH (ref 70–99)

## 2021-11-26 MED ORDER — FAMOTIDINE IN NACL 20-0.9 MG/50ML-% IV SOLN
20.0000 mg | Freq: Once | INTRAVENOUS | Status: AC
Start: 2021-11-26 — End: 2021-11-26
  Administered 2021-11-26: 20 mg via INTRAVENOUS
  Filled 2021-11-26: qty 50

## 2021-11-26 MED ORDER — ALUM & MAG HYDROXIDE-SIMETH 200-200-20 MG/5ML PO SUSP
30.0000 mL | Freq: Once | ORAL | Status: AC
  Administered 2021-11-26: 30 mL via ORAL
  Filled 2021-11-26: qty 30

## 2021-11-26 NOTE — Telephone Encounter (Signed)
Patient Name: Megan Mills Gender: Female DOB: 06-Oct-1970 Age: 51 Y 10 M 2 D Return Phone Number: [phone] (Primary) Address: City/ State/ Zip: [place]  [address] Client [facility] [place] Client Site [facility] [place] [facility] Provider Billey Chang- MD Contact Type Call Who Is Calling Patient / Member / Family / Caregiver Call Type Triage / Clinical Relationship To Patient Self Return Phone Number [phone] (Primary) Chief Complaint NUMBNESS/TINGLING- sudden on one side of the body or face Reason for Call Symptomatic / Request for [place] states she is having concerning symptoms and needs to know if she needs to go to urgent care. Additional Comment Feels air bubbles under bra line, Dr stated it is not heartburn, Pt has feeling after eating more frequently, after workout this morning, she could not catch her breath. Translation No   Nurse Assessment Nurse: Janene Madeira, RN, Wells Guiles Date/Time (Eastern Time): 11/26/2021 3:00:27 PM Confirm and document reason for call. If symptomatic, describe symptoms. ---Feels air bubbles under bra line, Dr stated it is not heartburn, Pt has feeling after eating more frequently, after workout this morning, she could not catch her breath. numbness of right side of her face and jaw. pt states she is nervous and her heart is racing. Does the patient have any new or worsening symptoms? ---Yes Will a triage be completed? ---Yes Related visit to physician within the last 2 weeks? ---No Does the PT have any chronic conditions? (i.e. diabetes, asthma, this includes High risk factors for pregnancy, etc.) ---No Is the patient pregnant or possibly pregnant? (Ask all females between the ages of 94-55) ---No Is this a behavioral health or substance abuse call? ---No  Guidelines Guideline Title Affirmed Question Affirmed Notes Nurse Date/Time  (Eastern Time) Neurologic Deficit [1] Numbness (i.e., loss of sensation) of the face, arm / hand, or leg / foot on one side of the body AND [2] sudden onset AND [3] present now Janene Madeira, RN, Wells Guiles 11/26/2021 3:03:06 PM Disp. Time Eilene Ghazi Time) Disposition Final User 11/26/2021 2:59:34 PM Send to Urgent Eduard Clos 11/26/2021 3:07:52 PM Call EMS 911 Now Yes Torrens, RN, Wells Guiles 11/26/2021 3:09:34 PM 911 Outcome Documentation Torrens, RN, Wells Guiles Reason: pt is presenting with increasing numbness on the right side of her face and working down her arm. pt is stating that EMS is en route. Final Disposition 11/26/2021 3:07:52 PM Call EMS 911 Now Yes Torrens, RN, Romualdo Bolk Disagree/Comply Comply Caller Understands Yes PreDisposition InappropriateToAsk Care Advice Given Per Guideline CALL EMS 911 NOW: * Immediate medical attention is needed. You need to hang up and call 911 (or an ambulance). * Triager Discretion: I'll call you back in a few minutes to be sure you were able to reach them. CARE ADVICE given per Neurologic Deficit (Adult) guideline.

## 2021-11-26 NOTE — ED Triage Notes (Signed)
Patient arrives with complaints of worsening upper epigastric pain (for a few months. Patient also reports an episode of right jaw numbness and finger numbness today around 1330.  Patient called EMS and they evaluated her at the scene after she spoke on the phone with her PCP nurse.   EMS vitals: 138/92 BP 152 CBG 112 HR 98.61F

## 2021-11-26 NOTE — Discharge Instructions (Addendum)
Your evaluation today was reassuring.  We do not see signs of a heart attack or stroke.  Your electrolytes, kidney and liver function look good.  Given that you have continued to have these intermittent episodes of chest pain I think you would benefit from following up with your primary care doctor as well as with a cardiologist.  Could be related to indigestion so I think it is reasonable for you to take Pepcid twice daily before breakfast and dinner to see if this helps with your symptoms.  If you develop worsening symptoms such as worsening chest pain, shortness of breath, feeling like you are going to pass out, severe headache, persistent numbness or weakness on one side of your body you should return for reevaluation.

## 2021-11-26 NOTE — ED Provider Notes (Signed)
[facility] Provider Note   CSN: [id number] Arrival date & time: 11/26/21  1630     History  Chief Complaint  Patient presents with   Numbness   Abdominal Pain   Chest Pain    Megan Mills is a 51 y.o. female.  Megan Mills is a 51 y.o. female with history of seasonal allergies, otherwise healthy, who presents to the emergency department accompanied by her husband for evaluation of intermittent chest and epigastric pain for a few months, worsening today with numbness to the right side of her face and jaw.  Patient reports that for the past few months she has intermittently been having a central and left-sided chest pressure, she thought this may be due to indigestion.  She reports that today she had the chest pressure more intensely and then started to notice that she had numbness and tingling on the right side of her face and jaw.  She also reports some tingling in her right arm.  She reports numbness and tingling started at 1:30 today.  She denies associated headache, no visual changes, no change in speech or slurred speech.  She denies any associated weakness in the extremities and no facial droop noted.  She called EMS who evaluated her on scene, elected to come to the hospital via [facility].  No cardiac history, has spoken to her PCP about a potential cardiology referral given intermittent chest pains.  Has not tried anything for potential indigestion.  No associated lightheadedness or syncope with episode today.  No smoking history, no history of hypertension, hyperlipidemia or diabetes.   The history is provided by the patient, the spouse and medical records.  Abdominal Pain Associated symptoms: chest pain   Associated symptoms: no chills, no cough, no fever and no shortness of breath        Home Medications Prior to Admission medications   Medication Sig Start Date End Date Taking? Authorizing Provider  Calcium Carbonate-Vitamin D (CALCIUM PLUS  VITAMIN D PO) Take 2 each by mouth daily. Calcium 500 mg and Vit D 700 IU    [provider]  COLLAGEN PO Take by mouth.    [provider]  fluticasone (FLONASE) 50 MCG/ACT nasal spray Place into both nostrils as needed for allergies or rhinitis.    [provider]  levonorgestrel (MIRENA) 20 MCG/24HR IUD 1 each by Intrauterine route once. Every 5 years    [provider]  loratadine (CLARITIN) 10 MG tablet Take 10 mg by mouth daily as needed for allergies. Patient not taking: Reported on 09/16/2021    [provider]      Allergies    Penicillins    Review of Systems   Review of Systems  Constitutional:  Negative for chills and fever.  HENT: Negative.    Respiratory:  Negative for cough and shortness of breath.   Cardiovascular:  Positive for chest pain. Negative for palpitations and leg swelling.  Gastrointestinal:  Positive for abdominal pain.  Neurological:  Positive for numbness. Negative for dizziness, seizures, syncope, facial asymmetry, speech difficulty, weakness, light-headedness and headaches.    Physical Exam Updated Vital Signs BP 137/75   Pulse 95   Temp 98.6 F (37 C) (Tympanic)   Resp 18   Ht '5\' 4"'$  (1.626 m)   Wt 86.2 kg   SpO2 99%   BMI 32.61 kg/m  Physical Exam Vitals and nursing note reviewed.  Constitutional:      General: She is not in acute  distress.    Appearance: Normal appearance. She is well-developed. She is not ill-appearing or diaphoretic.  HENT:     Head: Normocephalic and atraumatic.  Eyes:     General:        Right eye: No discharge.        Left eye: No discharge.     Pupils: Pupils are equal, round, and reactive to light.  Cardiovascular:     Rate and Rhythm: Normal rate and regular rhythm.     Pulses: Normal pulses.     Heart sounds: Normal heart sounds.  Pulmonary:     Effort: Pulmonary effort is normal. No respiratory distress.     Breath sounds: Normal breath sounds. No wheezing or  rales.     Comments: Respirations equal and unlabored, patient able to speak in full sentences, lungs clear to auscultation bilaterally  Abdominal:     General: Bowel sounds are normal. There is no distension.     Palpations: Abdomen is soft. There is no mass.     Tenderness: There is no abdominal tenderness. There is no guarding.     Comments: Abdomen soft, nondistended, nontender to palpation in all quadrants without guarding or peritoneal signs  Musculoskeletal:        General: No deformity.     Cervical back: Neck supple.  Skin:    General: Skin is warm and dry.     Capillary Refill: Capillary refill takes less than 2 seconds.  Neurological:     Mental Status: She is alert and oriented to person, place, and time.     Coordination: Coordination normal.     Comments: Speech is clear, able to follow commands CN III-XII intact, no sensory deficit on the right side of the face compared to the left, no facial droop Normal strength in upper and lower extremities bilaterally including dorsiflexion and plantar flexion, strong and equal grip strength Sensation normal to light and sharp touch Moves extremities without ataxia, coordination intact Normal finger-to-nose, no pronator drift  Psychiatric:        Mood and Affect: Mood normal.        Behavior: Behavior normal.     ED Results / Procedures / Treatments   Labs (all labs ordered are listed, but only abnormal results are displayed) Labs Reviewed  COMPREHENSIVE METABOLIC PANEL - Abnormal; Notable for the following components:      Result Value   Glucose, Bld 106 (*)    All other components within normal limits  URINALYSIS, ROUTINE W REFLEX MICROSCOPIC - Abnormal; Notable for the following components:   Color, Urine COLORLESS (*)    APPearance HAZY (*)    Specific Gravity, Urine <1.005 (*)    Hgb urine dipstick TRACE (*)    Leukocytes,Ua MODERATE (*)    Bacteria, UA FEW (*)    All other components within normal limits  CBG  MONITORING, ED - Abnormal; Notable for the following components:   Glucose-Capillary 103 (*)    All other components within normal limits  RESP PANEL BY RT-PCR (FLU A&B, COVID) ARPGX2  ETHANOL  PROTIME-INR  APTT  CBC  DIFFERENTIAL  RAPID URINE DRUG SCREEN, HOSP PERFORMED  PREGNANCY, URINE  LIPASE, BLOOD  TROPONIN I (HIGH SENSITIVITY)  TROPONIN I (HIGH SENSITIVITY)    EKG EKG Interpretation  Date/Time:  Friday November 26 2021 17:00:46 EDT Ventricular Rate:  93 PR Interval:  152 QRS Duration: 78 QT Interval:  344 QTC Calculation: 427 R Axis:   89 Text Interpretation: Normal sinus  rhythm Cannot rule out Anterior infarct (cited on or before 07-Jun-2020) Abnormal ECG When compared with ECG of 07-Jun-2020 11:44, No significant change was found Confirmed by Margaretmary Eddy ([id number]) on 11/26/2021 5:13:43 PM  Radiology CT HEAD WO CONTRAST  Result Date: 11/26/2021 CLINICAL DATA:  Right jaw numbness, initial encounter EXAM: CT HEAD WITHOUT CONTRAST TECHNIQUE: Contiguous axial images were obtained from the base of the skull through the vertex without intravenous contrast. RADIATION DOSE REDUCTION: This exam was performed according to the departmental dose-optimization program which includes automated exposure control, adjustment of the mA and/or kV according to patient size and/or use of iterative reconstruction technique. COMPARISON:  06/07/2020 FINDINGS: Brain: No evidence of acute infarction, hemorrhage, hydrocephalus, extra-axial collection or mass lesion/mass effect. Vascular: No hyperdense vessel or unexpected calcification. Skull: Normal. Negative for fracture or focal lesion. Sinuses/Orbits: No acute finding. Other: None. IMPRESSION: No acute intracranial abnormality noted. Electronically Signed   By: Inez Catalina M.D.   On: 11/26/2021 17:27   DG Chest 2 View  Result Date: 11/26/2021 CLINICAL DATA:  Chest pain EXAM: CHEST - 2 VIEW COMPARISON:  06/07/2020 FINDINGS: The heart size and  mediastinal contours are within normal limits. Both lungs are clear. The visualized skeletal structures are unremarkable. IMPRESSION: No active cardiopulmonary disease. Electronically Signed   By: Inez Catalina M.D.   On: 11/26/2021 17:22    Procedures Procedures    Medications Ordered in ED Medications  famotidine (PEPCID) IVPB 20 mg premix (0 mg Intravenous Stopped 11/26/21 1909)  alum & mag hydroxide-simeth (MAALOX/MYLANTA) 200-200-20 MG/5ML suspension 30 mL (30 mLs Oral Given 11/26/21 1853)    ED Course/ Medical Decision Making/ A&P                           Medical Decision Making Amount and/or Complexity of Data Reviewed Labs: ordered. Radiology: ordered.  Risk OTC drugs. Prescription drug management.   51 y.o. female presents to the ED with complaints of chest pain, facial numbness, this involves an extensive number of treatment options, and is a complaint that carries with it a high risk of complications and morbidity.  The differential diagnosis includes stroke, ACS, intracranial hemorrhage, PE, dissection, musculoskeletal pain, GERD, anxiety  On arrival pt is nontoxic, vitals WNL. Exam significant for no sensory deficits on the face and no other neurologic deficits  Additional history obtained from husband at bedside. Previous records obtained and reviewed   Performed neurologic exam on arrival, patient currently without deficits, no code stroke  Lab Tests:  I Ordered, reviewed, and interpreted labs, which included: No leukocytosis and normal hemoglobin, no electrolyte derangements, normal renal and liver function, troponins negative x2, normal lipase, normal coags.  Negative pregnancy.  Negative COVID, UA with some leukocytes, and a few WBCs, squamous cells present, suspect contamination as patient not experiencing urinary symptoms.  Imaging Studies ordered:  I ordered imaging studies which included chest x-ray and CT head, I independently visualized and interpreted imaging  which showed no active cardiopulmonary disease, no acute intracranial abnormalities  ED Course:   Patient without significant cardiac risk factors and 2 negative troponins, no signs of ACS, and do not feel patient needs admission for more immediate further cardiac work-up.  We will plan to refer to cardiology and have her follow-up with her PCP as well.  Given intermittent symptoms over the past month do think she would benefit from further evaluation.  Could also be related to indigestion/reflux.  We will have patient  begin taking Pepcid regularly.  Facial numbness could have related to radiating pain from chest pain, versus anxiety, but patient with no focal neurologic deficits and negative head CT.  Discussed case with Dr. Quinn Axe with neurology, does not feel that patient needs emergent MRI, overall very low suspicion for stroke.  Discussed reassuring work-up with patient and husband at bedside.  Cardiology referral placed and discussed outpatient follow-up and return precautions.  Discharged home in good condition.   Portions of this note were generated with [employer]. Dictation errors may occur despite best attempts at proofreading.         Final Clinical Impression(s) / ED Diagnoses Final diagnoses:  Central chest pain  Facial paresthesia    Rx / DC Orders ED Discharge Orders          Ordered    Ambulatory referral to Cardiology       Comments: If you have not heard from the Cardiology office within the next 72 hours please call [phone].   11/26/21 2020              Jacqlyn Larsen, PA-C 11/27/21 1049    Fransico Meadow, MD 11/29/21 1140

## 2021-11-26 NOTE — Telephone Encounter (Signed)
Pt states: -experiencing "Concerning symptoms " -wants to speak to nurse to see if UC is needed.  Referred to PCP triage nurse. Spoke with pamela. Awaiting follow up notes

## 2021-12-13 ENCOUNTER — Ambulatory Visit
Admission: RE | Admit: 2021-12-13 | Discharge: 2021-12-13 | Disposition: A | Discharge status: Home / Self Care | Payer: Managed Care, Other (non HMO) | Source: Ambulatory Visit | Attending: Family Medicine | Admitting: Family Medicine

## 2021-12-13 DIAGNOSIS — Z1231 Encounter for screening mammogram for malignant neoplasm of breast: Secondary | ICD-10-CM

## 2021-12-15 ENCOUNTER — Other Ambulatory Visit: Payer: Self-pay | Admitting: Family Medicine

## 2021-12-15 DIAGNOSIS — R928 Other abnormal and inconclusive findings on diagnostic imaging of breast: Secondary | ICD-10-CM

## 2021-12-28 NOTE — Progress Notes (Signed)
Cardiology Office Note:    Date:  12/29/2021   ID:  Megan Mills, DOB Sep 20, 1970, MRN [id number]  PCP:  Leamon Arnt, MD   [place] Prov[facility] Cardiologist:  Skeet Latch, MD     Referring MD: Jacqlyn Larsen, PA-C   No chief complaint on file.   History of Present Illness:    Megan Mills is a 51 y.o. female with no pertinent cardiovascular hx, here for the evaluation of central chest pain. She presented to the ED 11/26/2021 complaining of intermittent chest and epigastric pain ongoing for a few months. That morning her pain worsened with numbness to the right side of her face and jaw. Work-up was reassuring: no leukocytosis and normal hemoglobin, no electrolyte derangements, normal renal and liver function, troponins negative x2. Chest x-ray and CT head showed no active cardiopulmonary disease, no acute intracranial abnormalities. It was thought her symptoms could be related to indigestion/reflux. She was started on Pepcid and referred to cardiology as an outpatient.  Today, she notes that her chest discomfort began about a month ago when she was having intermittent chest pains that felt "like little bubbles"; this was not always localized to the same area. She notes that this felt more like a pressure rather than pain. Also she complains of a  separate chronic ache on the underside of her RUE. On the day of her ED visit she had been driving and her jaw suddenly became numb. Since then, this numbness has recurred in her right jaw about 3 times in the last month. She also continues to have the intermittent "pangs" or "twinges" on either side of her chest and inferior to her breasts. She has been unable to pinpoint exact triggers or correlations regarding her chest discomfort. It mostly occurs in the middle of the day. She notes increased chest pressure after consuming peanut butter, tomato sauce, ice cream, ranch dressing, caffeine (maybe), and chocolate. She also believes her  symptoms are exacerbated by life stressors. When stressed, she admits to having more fast foods. After her event she cut back on drinking diet coke, but has gradually returned to drinking them. She noticed no differences when she wasn't drinking the diet coke. No alcohol consumption or tobacco use. She had stopped taking calcium, vitamin D, and collagen supplements until today's discussion, but she plans to resume these. For exercise, she participates in circuit training 3x a week, and pilates 3x a week. She also has a Peloton at home but has not used it since her chest discomfort began. She denies any recent changes in her exercise tolerance. She denies any palpitations, shortness of breath, or peripheral edema. No lightheadedness, headaches, syncope, orthopnea, or PND.   Past Medical History:  Diagnosis Date   Allergy    Atypical chest pain 12/29/2021   Chicken pox    Hay fever    UTI (urinary tract infection)     Past Surgical History:  Procedure Laterality Date   KNEE ARTHROSCOPY Left     Current Medications: Current Meds  Medication Sig   fluticasone (FLONASE) 50 MCG/ACT nasal spray Place into both nostrils as needed for allergies or rhinitis.   ivabradine (CORLANOR) 5 MG TABS tablet TAKE 1 TABLET 2 HOURS PRIOR TO CT   levonorgestrel (MIRENA) 20 MCG/24HR IUD 1 each by Intrauterine route once. Every 5 years   metoprolol tartrate (LOPRESSOR) 25 MG tablet TAKE 1 TABLET 2 HOURS PRIOR TO CT   pantoprazole (PROTONIX) 40 MG tablet Take 1 tablet (40  mg total) by mouth daily.     Allergies:   Penicillins   Social History   Socioeconomic History   Marital status: Married    Spouse name: Not on file   Number of children: 2   Years of education: Not on file   Highest education level: Some college, no degree  Occupational History   Not on file  Tobacco Use   Smoking status: Never   Smokeless tobacco: Never  Vaping Use   Vaping Use: Never used  Substance and Sexual Activity    Alcohol use: Not Currently   Drug use: Never   Sexual activity: Yes    Birth control/protection: I.U.D.  Other Topics Concern   Not on file  Social History Narrative   Moved from [place]/ ([place] area) in oct 2020; originally from [place]   Former buyer for [employer]    Mother of son and daughter.   Husband: taxes, works remotely for company in [place] Strain: [place]  (12/29/2021)   Overall Financial Resource Strain (CARDIA)    Difficulty of Paying Living Expenses: Not hard at all  Food Insecurity: Not on file  Transportation Needs: No Transportation Needs (12/29/2021)   PRAPARE - Hydrologist (Medical): No    Lack of Transportation (Non-Medical): No  Physical Activity: Sufficiently Active (12/29/2021)   Exercise Vital Sign    Days of Exercise per Week: 6 days    Minutes of Exercise per Session: 60 min  Stress: Not on file  Social Connections: Not on file     Family History: The patient's family history includes Bipolar disorder in her mother; Depression in her mother; Hearing loss in her maternal grandmother; Heart attack in her paternal grandmother; Hypertension in her maternal grandmother and mother; Mental illness in her mother. There is no history of Colon cancer, Colon polyps, Esophageal cancer, Rectal cancer, or Stomach cancer.  ROS:   Please see the history of present illness.    (+) Chest pressure (+) Right jaw numbness (+) Stress All other systems reviewed and are negative.  EKGs/Labs/Other Studies Reviewed:    The following studies were reviewed today:  No prior cardiovascular studies available.   EKG:   EKG is personally reviewed. 12/29/2021: Sinus rhythm. Rate 81 bpm.  Recent Labs: 11/26/2021: ALT 11; BUN 17; Creatinine, Ser 0.87; Hemoglobin 12.7; Platelets 292; Potassium 3.9; Sodium 139   Recent Lipid Panel    Component Value Date/Time   CHOL 171 10/07/2021 0819   TRIG 43.0 10/07/2021 0819    HDL 43.30 10/07/2021 0819   CHOLHDL 4 10/07/2021 0819   VLDL 8.6 10/07/2021 0819   LDLCALC 119 (H) 10/07/2021 0819     Risk Assessment/Calculations:           Physical Exam:    Wt Readings from Last 3 Encounters:  12/29/21 194 lb 12.8 oz (88.4 kg)  11/26/21 190 lb (86.2 kg)  09/16/21 196 lb (88.9 kg)     VS:  BP 110/80 (BP Location: Left Arm, Patient Position: Sitting, Cuff Size: Large)   Pulse 81   Ht '5\' 4"'$  (1.626 m)   Wt 194 lb 12.8 oz (88.4 kg)   BMI 33.44 kg/m  , BMI Body mass index is 33.44 kg/m. GENERAL:  Well appearing HEENT: Pupils equal round and reactive, fundi not visualized, oral mucosa unremarkable NECK:  No jugular venous distention, waveform within normal limits, carotid upstroke brisk and symmetric, no bruits,  no thyromegaly LUNGS:  Clear to auscultation bilaterally HEART:  RRR.  PMI not displaced or sustained,S1 and S2 within normal limits, no S3, no S4, no clicks, no rubs, no murmurs ABD:  Flat, positive bowel sounds normal in frequency in pitch, no bruits, no rebound, no guarding, no midline pulsatile mass, no hepatomegaly, no splenomegaly EXT:  2 plus pulses throughout, no edema, no cyanosis no clubbing SKIN:  No rashes no nodules NEURO:  Cranial nerves II through XII grossly intact, motor grossly intact throughout PSYCH:  Cognitively intact, oriented to person place and time   ASSESSMENT:    1. Atypical chest pain   2. Obesity (BMI 30-39.9)    PLAN:    Atypical chest pain Her chest pain is atypical and seems to center around foods.  She exercises regularly and has no exertional symptoms.  She brought a list of trigger foods and we suggested trying to avoid these.  We will also give her a trial of Protonix 40 mg daily.  In order to rule out obstructive coronary disease we will get a coronary CTA.  Her blood pressure is fairly low so we will give metoprolol 25 mg and ivabradine 5 mg prior to her coronary CTA.  Obesity (BMI 30-39.9) She has  struggled with weight gain recently.  I suspect this is contributing to acid reflux symptoms.  She is doing a good job with exercise and encouraged her to get back on her Peloton.  She is going to work on her diet.  Disposition: FU with APP in 2 months. FU with Fronnie Urton C. Oval Linsey, MD, [facility]  PRN.  Medication Adjustments/Labs and Tests Ordered: Current medicines are reviewed at length with the patient today.  Concerns regarding medicines are outlined above.   Orders Placed This Encounter  Procedures   CT CORONARY MORPH W/CTA COR W/SCORE W/CA W/CM &/OR WO/CM   Basic metabolic panel   EKG [id number]   Meds ordered this encounter  Medications   pantoprazole (PROTONIX) 40 MG tablet    Sig: Take 1 tablet (40 mg total) by mouth daily.    Dispense:  90 tablet    Refill:  0   ivabradine (CORLANOR) 5 MG TABS tablet    Sig: TAKE 1 TABLET 2 HOURS PRIOR TO CT    Dispense:  1 tablet    Refill:  0    WILL PAY OUT OF POCKET IF NOT COVERED BY INSURANCE & USE GOOD RX   metoprolol tartrate (LOPRESSOR) 25 MG tablet    Sig: TAKE 1 TABLET 2 HOURS PRIOR TO CT    Dispense:  1 tablet    Refill:  0   Patient Instructions  Medication Instructions:  TAKE METOPROLOL 25 MG 1 TABLET 2 HOURS PRIOR TO CT  TAKE IVABRADINE 5 MG 1 TABLET 2 HOUR PRIOR TO CT   START PANTOPRAZOLE 40 MG DAILY   *If you need a refill on your cardiac medications before your next appointment, please call your pharmacy*  Lab Work: BMET TODAY   If you have labs (blood work) drawn today and your tests are completely normal, you will receive your results only by: [employer] (if you have MyChart) OR A paper copy in the mail If you have any lab test that is abnormal or we need to change your treatment, we will call you to review the results.  Testing/Procedures: Your physician has requested that you have cardiac CT. Cardiac computed tomography (CT) is a painless test that uses an x-ray machine to take  clear, detailed pictures of  your heart. For further information please visit [email or web address]. Please follow instruction sheet as given  Follow-Up: At [facility], you and your health needs are our priority.  As part of our continuing mission to provide you with exceptional heart care, we have created designated Provider Care Teams.  These Care Teams include your primary Cardiologist (physician) and Advanced Practice Providers (APPs -  Physician Assistants and Nurse Practitioners) who all work together to provide you with the care you need, when you need it.  We recommend signing up for the patient portal called "MyChart".  Sign up information is provided on this After Visit Summary.  MyChart is used to connect with patients for Virtual Visits (Telemedicine).  Patients are able to view lab/test results, encounter notes, upcoming appointments, etc.  Non-urgent messages can be sent to your provider as well.   To learn more about what you can do with MyChart, go to [email or web address].    Your next appointment:   2 month(s)  The format for your next appointment:   In Person  Provider:   Skeet Latch, MD or Laurann [place], NP    Other Instructions   Your cardiac CT will be scheduled at one of the below locations:   [facility] [address] [place], [place] [address] [phone]  [place] [address] [place],  [address] [phone]  If scheduled at [facility], please arrive at the [facility] (Entrance C2) of [facility] 30 minutes prior to test start time. You can use the FREE valet parking offered at entrance C (encouraged to control the heart rate for the test)  Proceed to the [facility] Radiology Department (first floor) to check-in and test prep.  All radiology patients and guests should use entrance C2 at [facility], accessed from [facility], even  though the hospital's physical address listed is [address]    If scheduled at [facility], please arrive 15 mins early for check-in and test prep.  Please follow these instructions carefully (unless otherwise directed):  Hold all erectile dysfunction medications at least 3 days (72 hrs) prior to test.  On the Night Before the Test: Be sure to Drink plenty of water. Do not consume any caffeinated/decaffeinated beverages or chocolate 12 hours prior to your test. Do not take any antihistamines 12 hours prior to your test.  On the Day of the Test: Drink plenty of water until 1 hour prior to the test. Do not eat any food 4 hours prior to the test. You may take your regular medications prior to the test.  Take metoprolol (Lopressor) two hours prior to test. HOLD Furosemide/Hydrochlorothiazide morning of the test. FEMALES- please wear underwire-free bra if available, avoid dresses & tight clothing      After the Test: Drink plenty of water. After receiving IV contrast, you may experience a mild flushed feeling. This is normal. On occasion, you may experience a mild rash up to 24 hours after the test. This is not dangerous. If this occurs, you can take Benadryl 25 mg and increase your fluid intake. If you experience trouble breathing, this can be serious. If it is severe call 911 IMMEDIATELY. If it is mild, please call our office. If you take any of these medications: Glipizide/Metformin, Avandament, Glucavance, please do not take 48 hours after completing test unless otherwise instructed.  We will call  to schedule your test 2-4 weeks out understanding that some insurance companies will need an authorization prior to the service being performed.   For non-scheduling related questions, please contact the cardiac imaging nurse navigator should you have any questions/concerns: Marchia Bond, Cardiac Imaging Nurse Navigator Gordy Clement, Cardiac  Imaging Nurse Navigator [facility] Direct Office Dial: [phone]   For scheduling needs, including cancellations and rescheduling, please call [place], [phone].  Cardiac CT Angiogram A cardiac CT angiogram is a procedure to look at the heart and the area around the heart. It may be done to help find the cause of chest pains or other symptoms of heart disease. During this procedure, a substance called contrast dye is injected into the blood vessels in the area to be checked. A large X-ray machine, called a CT scanner, then takes detailed pictures of the heart and the surrounding area. The procedure is also sometimes called a coronary CT angiogram, coronary artery scanning, or CTA. A cardiac CT angiogram allows the health care provider to see how well blood is flowing to and from the heart. The health care provider will be able to see if there are any problems, such as: Blockage or narrowing of the coronary arteries in the heart. Fluid around the heart. Signs of weakness or disease in the muscles, valves, and tissues of the heart. Tell a health care provider about: Any allergies you have. This is especially important if you have had a previous allergic reaction to contrast dye. All medicines you are taking, including vitamins, herbs, eye drops, creams, and over-the-counter medicines. Any blood disorders you have. Any surgeries you have had. Any medical conditions you have. Whether you are pregnant or may be pregnant. Any anxiety disorders, chronic pain, or other conditions you have that may increase your stress or prevent you from lying still. What are the risks? Generally, this is a safe procedure. However, problems may occur, including: Bleeding. Infection. Allergic reactions to medicines or dyes. Damage to other structures or organs. Kidney damage from the contrast dye that is used. Increased risk of cancer from radiation exposure. This risk is low.  Talk with your health care provider about: The risks and benefits of testing. How you can receive the lowest dose of radiation. What happens before the procedure? Wear comfortable clothing and remove any jewelry, glasses, dentures, and hearing aids. Follow instructions from your health care provider about eating and drinking. This may include: For 12 hours before the procedure -- avoid caffeine. This includes tea, coffee, soda, energy drinks, and diet pills. Drink plenty of water or other fluids that do not have caffeine in them. Being well hydrated can prevent complications. For 4-6 hours before the procedure -- stop eating and drinking. The contrast dye can cause nausea, but this is less likely if your stomach is empty. Ask your health care provider about changing or stopping your regular medicines. This is especially important if you are taking diabetes medicines, blood thinners, or medicines to treat problems with erections (erectile dysfunction). What happens during the procedure?  Hair on your chest may need to be removed so that small sticky patches called electrodes can be placed on your chest. These will transmit information that helps to monitor your heart during the procedure. An IV will be inserted into one of your veins. You might be given a medicine to control your heart rate during the procedure. This will help to ensure that good images are obtained. You will be asked to  lie on an exam table. This table will slide in and out of the CT machine during the procedure. Contrast dye will be injected into the IV. You might feel warm, or you may get a metallic taste in your mouth. You will be given a medicine called nitroglycerin. This will relax or dilate the arteries in your heart. The table that you are lying on will move into the CT machine tunnel for the scan. The person running the machine will give you instructions while the scans are being done. You may be asked to: Keep your arms  above your head. Hold your breath. Stay very still, even if the table is moving. When the scanning is complete, you will be moved out of the machine. The IV will be removed. The procedure may vary among health care providers and hospitals. What can I expect after the procedure? After your procedure, it is common to have: A metallic taste in your mouth from the contrast dye. A feeling of warmth. A headache from the nitroglycerin. Follow these instructions at home: Take over-the-counter and prescription medicines only as told by your health care provider. If you are told, drink enough fluid to keep your urine pale yellow. This will help to flush the contrast dye out of your body. Most people can return to their normal activities right after the procedure. Ask your health care provider what activities are safe for you. It is up to you to get the results of your procedure. Ask your health care provider, or the department that is doing the procedure, when your results will be ready. Keep all follow-up visits as told by your health care provider. This is important. Contact a health care provider if: You have any symptoms of allergy to the contrast dye. These include: Shortness of breath. Rash or hives. A racing heartbeat. Summary A cardiac CT angiogram is a procedure to look at the heart and the area around the heart. It may be done to help find the cause of chest pains or other symptoms of heart disease. During this procedure, a large X-ray machine, called a CT scanner, takes detailed pictures of the heart and the surrounding area after a contrast dye has been injected into blood vessels in the area. Ask your health care provider about changing or stopping your regular medicines before the procedure. This is especially important if you are taking diabetes medicines, blood thinners, or medicines to treat erectile dysfunction. If you are told, drink enough fluid to keep your urine pale yellow.  This will help to flush the contrast dye out of your body. This information is not intended to replace advice given to you by your health care provider. Make sure you discuss any questions you have with your health care provider. Document Revised: 07/29/2021 Document Reviewed: 12/05/2018 Elsevier Patient Education  [place].     I,Mathew Stumpf,acting as a Education administrator for Skeet Latch, MD.,have documented all relevant documentation on the behalf of Skeet Latch, MD,as directed by  Skeet Latch, MD while in the presence of Skeet Latch, MD.  I, [place] Oval Linsey, MD have reviewed all documentation for this visit.  The documentation of the exam, diagnosis, procedures, and orders on 12/29/2021 are all accurate and complete.   Signed, Skeet Latch, MD  12/29/2021 5:09 PM    [place]

## 2021-12-29 ENCOUNTER — Encounter (HOSPITAL_BASED_OUTPATIENT_CLINIC_OR_DEPARTMENT_OTHER): Payer: Self-pay | Admitting: Cardiovascular Disease

## 2021-12-29 ENCOUNTER — Ambulatory Visit
Admission: RE | Admit: 2021-12-29 | Discharge: 2021-12-29 | Disposition: A | Discharge status: Home / Self Care | Payer: Managed Care, Other (non HMO) | Source: Ambulatory Visit | Attending: Family Medicine | Admitting: Family Medicine

## 2021-12-29 ENCOUNTER — Ambulatory Visit (INDEPENDENT_AMBULATORY_CARE_PROVIDER_SITE_OTHER): Payer: Managed Care, Other (non HMO) | Admitting: Cardiovascular Disease

## 2021-12-29 DIAGNOSIS — R0789 Other chest pain: Secondary | ICD-10-CM | POA: Diagnosis not present

## 2021-12-29 DIAGNOSIS — E669 Obesity, unspecified: Secondary | ICD-10-CM

## 2021-12-29 DIAGNOSIS — R928 Other abnormal and inconclusive findings on diagnostic imaging of breast: Secondary | ICD-10-CM

## 2021-12-29 HISTORY — DX: Other chest pain: R07.89

## 2021-12-29 MED ORDER — IVABRADINE HCL 5 MG PO TABS: ORAL_TABLET | ORAL | 0 refills | Status: DC

## 2021-12-29 MED ORDER — PANTOPRAZOLE SODIUM 40 MG PO TBEC: 40.0000 mg | DELAYED_RELEASE_TABLET | Freq: Every day | ORAL | 0 refills | Status: DC

## 2021-12-29 MED ORDER — METOPROLOL TARTRATE 25 MG PO TABS: ORAL_TABLET | ORAL | 0 refills | Status: DC

## 2021-12-29 NOTE — Assessment & Plan Note (Signed)
Her chest pain is atypical and seems to center around foods.  She exercises regularly and has no exertional symptoms.  She brought a list of trigger foods and we suggested trying to avoid these.  We will also give her a trial of Protonix 40 mg daily.  In order to rule out obstructive coronary disease we will get a coronary CTA.  Her blood pressure is fairly low so we will give metoprolol 25 mg and ivabradine 5 mg prior to her coronary CTA.

## 2021-12-29 NOTE — Assessment & Plan Note (Signed)
She has struggled with weight gain recently.  I suspect this is contributing to acid reflux symptoms.  She is doing a good job with exercise and encouraged her to get back on her Peloton.  She is going to work on her diet.

## 2021-12-29 NOTE — Patient Instructions (Addendum)
Medication Instructions:  TAKE METOPROLOL 25 MG 1 TABLET 2 HOURS PRIOR TO CT  TAKE IVABRADINE 5 MG 1 TABLET 2 HOUR PRIOR TO CT   START PANTOPRAZOLE 40 MG DAILY   *If you need a refill on your cardiac medications before your next appointment, please call your pharmacy*  Lab Work: BMET TODAY   If you have labs (blood work) drawn today and your tests are completely normal, you will receive your results only by: MyChart Message (if you have MyChart) OR A paper copy in the mail If you have any lab test that is abnormal or we need to change your treatment, we will call you to review the results.  Testing/Procedures: Your physician has requested that you have cardiac CT. Cardiac computed tomography (CT) is a painless test that uses an x-ray machine to take clear, detailed pictures of your heart. For further information please visit [email or web address]. Please follow instruction sheet as given  Follow-Up: At [facility], you and your health needs are our priority.  As part of our continuing mission to provide you with exceptional heart care, we have created designated Provider Care Teams.  These Care Teams include your primary Cardiologist (physician) and Advanced Practice Providers (APPs -  Physician Assistants and Nurse Practitioners) who all work together to provide you with the care you need, when you need it.  We recommend signing up for the patient portal called "MyChart".  Sign up information is provided on this After Visit Summary.  MyChart is used to connect with patients for Virtual Visits (Telemedicine).  Patients are able to view lab/test results, encounter notes, upcoming appointments, etc.  Non-urgent messages can be sent to your provider as well.   To learn more about what you can do with MyChart, go to [email or web address].    Your next appointment:   2 month(s)  The format for your next appointment:   In Person  Provider:   Skeet Latch, MD or Laurann Montana, NP    Other Instructions   Your cardiac CT will be scheduled at one of the below locations:   [facility] [address] [place], [place] [address] [phone]  [place] [address] [place], [place] [address] [phone]  If scheduled at [facility], please arrive at the [facility] Entrance (Entrance C2) of [facility] 30 minutes prior to test start time. You can use the FREE valet parking offered at entrance C (encouraged to control the heart rate for the test)  Proceed to the [facility] Radiology Department (first floor) to check-in and test prep.  All radiology patients and guests should use entrance C2 at [facility], accessed from [facility], even though the hospital's physical address listed is [address].    If scheduled at [facility], please arrive 15 mins early for check-in and test prep.  Please follow these instructions carefully (unless otherwise directed):  Hold all erectile dysfunction medications at least 3 days (72 hrs) prior to test.  On the Night Before the Test: Be sure to Drink plenty of water. Do not consume any caffeinated/decaffeinated beverages or chocolate 12 hours prior to your test. Do not take any antihistamines 12 hours prior to your test.  On the Day of the Test: Drink plenty of water until 1 hour prior to the test. Do not eat any food 4 hours prior to the test. You may take your  regular medications prior to the test.  Take metoprolol (Lopressor) two hours prior to test. HOLD Furosemide/Hydrochlorothiazide morning of the test. FEMALES- please wear underwire-free bra if available, avoid dresses & tight clothing      After the Test: Drink plenty of water. After receiving IV contrast, you may experience a mild flushed feeling. This is normal. On occasion, you may experience a  mild rash up to 24 hours after the test. This is not dangerous. If this occurs, you can take Benadryl 25 mg and increase your fluid intake. If you experience trouble breathing, this can be serious. If it is severe call 911 IMMEDIATELY. If it is mild, please call our office. If you take any of these medications: Glipizide/Metformin, Avandament, Glucavance, please do not take 48 hours after completing test unless otherwise instructed.  We will call to schedule your test 2-4 weeks out understanding that some insurance companies will need an authorization prior to the service being performed.   For non-scheduling related questions, please contact the cardiac imaging nurse navigator should you have any questions/concerns: Marchia Bond, Cardiac Imaging Nurse Navigator Gordy Clement, Cardiac Imaging Nurse Navigator [facility] Direct Office Dial: [phone]   For scheduling needs, including cancellations and rescheduling, please call [place], [phone].  Cardiac CT Angiogram A cardiac CT angiogram is a procedure to look at the heart and the area around the heart. It may be done to help find the cause of chest pains or other symptoms of heart disease. During this procedure, a substance called contrast dye is injected into the blood vessels in the area to be checked. A large X-ray machine, called a CT scanner, then takes detailed pictures of the heart and the surrounding area. The procedure is also sometimes called a coronary CT angiogram, coronary artery scanning, or CTA. A cardiac CT angiogram allows the health care provider to see how well blood is flowing to and from the heart. The health care provider will be able to see if there are any problems, such as: Blockage or narrowing of the coronary arteries in the heart. Fluid around the heart. Signs of weakness or disease in the muscles, valves, and tissues of the heart. Tell a health care provider about: Any allergies  you have. This is especially important if you have had a previous allergic reaction to contrast dye. All medicines you are taking, including vitamins, herbs, eye drops, creams, and over-the-counter medicines. Any blood disorders you have. Any surgeries you have had. Any medical conditions you have. Whether you are pregnant or may be pregnant. Any anxiety disorders, chronic pain, or other conditions you have that may increase your stress or prevent you from lying still. What are the risks? Generally, this is a safe procedure. However, problems may occur, including: Bleeding. Infection. Allergic reactions to medicines or dyes. Damage to other structures or organs. Kidney damage from the contrast dye that is used. Increased risk of cancer from radiation exposure. This risk is low. Talk with your health care provider about: The risks and benefits of testing. How you can receive the lowest dose of radiation. What happens before the procedure? Wear comfortable clothing and remove any jewelry, glasses, dentures, and hearing aids. Follow instructions from your health care provider about eating and drinking. This may include: For 12 hours before the procedure -- avoid caffeine. This includes tea, coffee, soda, energy drinks, and diet pills. Drink plenty of water or other fluids that do not have caffeine in them. Being well hydrated can  prevent complications. For 4-6 hours before the procedure -- stop eating and drinking. The contrast dye can cause nausea, but this is less likely if your stomach is empty. Ask your health care provider about changing or stopping your regular medicines. This is especially important if you are taking diabetes medicines, blood thinners, or medicines to treat problems with erections (erectile dysfunction). What happens during the procedure?  Hair on your chest may need to be removed so that small sticky patches called electrodes can be placed on your chest. These will  transmit information that helps to monitor your heart during the procedure. An IV will be inserted into one of your veins. You might be given a medicine to control your heart rate during the procedure. This will help to ensure that good images are obtained. You will be asked to lie on an exam table. This table will slide in and out of the CT machine during the procedure. Contrast dye will be injected into the IV. You might feel warm, or you may get a metallic taste in your mouth. You will be given a medicine called nitroglycerin. This will relax or dilate the arteries in your heart. The table that you are lying on will move into the CT machine tunnel for the scan. The person running the machine will give you instructions while the scans are being done. You may be asked to: Keep your arms above your head. Hold your breath. Stay very still, even if the table is moving. When the scanning is complete, you will be moved out of the machine. The IV will be removed. The procedure may vary among health care providers and hospitals. What can I expect after the procedure? After your procedure, it is common to have: A metallic taste in your mouth from the contrast dye. A feeling of warmth. A headache from the nitroglycerin. Follow these instructions at home: Take over-the-counter and prescription medicines only as told by your health care provider. If you are told, drink enough fluid to keep your urine pale yellow. This will help to flush the contrast dye out of your body. Most people can return to their normal activities right after the procedure. Ask your health care provider what activities are safe for you. It is up to you to get the results of your procedure. Ask your health care provider, or the department that is doing the procedure, when your results will be ready. Keep all follow-up visits as told by your health care provider. This is important. Contact a health care provider if: You have any  symptoms of allergy to the contrast dye. These include: Shortness of breath. Rash or hives. A racing heartbeat. Summary A cardiac CT angiogram is a procedure to look at the heart and the area around the heart. It may be done to help find the cause of chest pains or other symptoms of heart disease. During this procedure, a large X-ray machine, called a CT scanner, takes detailed pictures of the heart and the surrounding area after a contrast dye has been injected into blood vessels in the area. Ask your health care provider about changing or stopping your regular medicines before the procedure. This is especially important if you are taking diabetes medicines, blood thinners, or medicines to treat erectile dysfunction. If you are told, drink enough fluid to keep your urine pale yellow. This will help to flush the contrast dye out of your body. This information is not intended to replace advice given to you by your  health care provider. Make sure you discuss any questions you have with your health care provider. Document Revised: 07/29/2021 Document Reviewed: 12/05/2018 Elsevier Patient Education  [place].

## 2021-12-30 LAB — BASIC METABOLIC PANEL
BUN/Creatinine Ratio: 17 (ref 9–23)
BUN: 15 mg/dL (ref 6–24)
CO2: 20 mmol/L (ref 20–29)
Calcium: 9.6 mg/dL (ref 8.7–10.2)
Chloride: 99 mmol/L (ref 96–106)
Creatinine, Ser: 0.86 mg/dL (ref 0.57–1.00)
Glucose: 135 mg/dL — ABNORMAL HIGH (ref 70–99)
Potassium: 4.3 mmol/L (ref 3.5–5.2)
Sodium: 139 mmol/L (ref 134–144)
eGFR: 82 mL/min/{1.73_m2} (ref >59)

## 2022-01-17 ENCOUNTER — Encounter: Payer: Self-pay | Admitting: *Deleted

## 2022-01-17 ENCOUNTER — Telehealth (HOSPITAL_COMMUNITY): Payer: Self-pay | Admitting: *Deleted

## 2022-01-17 NOTE — Telephone Encounter (Signed)
Reaching out to patient to offer assistance regarding upcoming cardiac imaging study; pt verbalizes understanding of appt date/time, parking situation and where to check in,  medications ordered, and verified current allergies; name and call back number provided for further questions should they arise  Gordy Clement RN Navigator Cardiac Imaging [facility] [phone] office [phone] cell  Patient to take '5mg'$  ivabradine and '25mg'$  metoprolol tartrate two hours prior to her cardiac CT scan. She is aware to arrive at 2pm.

## 2022-01-18 ENCOUNTER — Ambulatory Visit (HOSPITAL_COMMUNITY)
Admission: RE | Admit: 2022-01-18 | Discharge: 2022-01-18 | Disposition: A | Discharge status: Home / Self Care | Payer: Managed Care, Other (non HMO) | Source: Ambulatory Visit | Attending: Cardiovascular Disease | Admitting: Cardiovascular Disease

## 2022-01-18 DIAGNOSIS — R0789 Other chest pain: Secondary | ICD-10-CM | POA: Diagnosis not present

## 2022-01-18 DIAGNOSIS — E669 Obesity, unspecified: Secondary | ICD-10-CM | POA: Insufficient documentation

## 2022-01-18 MED ORDER — IOHEXOL 350 MG/ML SOLN
100.0000 mL | Freq: Once | INTRAVENOUS | Status: AC | PRN
  Administered 2022-01-18: 100 mL via INTRAVENOUS

## 2022-01-18 MED ORDER — NITROGLYCERIN 0.4 MG SL SUBL
0.8000 mg | SUBLINGUAL_TABLET | Freq: Once | SUBLINGUAL | Status: AC
Start: 2022-01-18 — End: 2022-01-18
  Administered 2022-01-18: 0.8 mg via SUBLINGUAL

## 2022-01-18 MED ORDER — NITROGLYCERIN 0.4 MG SL SUBL
SUBLINGUAL_TABLET | SUBLINGUAL | Status: AC
  Filled 2022-01-18: qty 2

## 2022-02-28 ENCOUNTER — Encounter (HOSPITAL_BASED_OUTPATIENT_CLINIC_OR_DEPARTMENT_OTHER): Payer: Self-pay | Admitting: Family

## 2022-02-28 ENCOUNTER — Ambulatory Visit (INDEPENDENT_AMBULATORY_CARE_PROVIDER_SITE_OTHER): Payer: Managed Care, Other (non HMO) | Admitting: Family

## 2022-02-28 VITALS — BP 120/76 | HR 98 | Ht 64.0 in | Wt 198.0 lb

## 2022-02-28 DIAGNOSIS — E669 Obesity, unspecified: Secondary | ICD-10-CM

## 2022-02-28 DIAGNOSIS — K219 Gastro-esophageal reflux disease without esophagitis: Secondary | ICD-10-CM | POA: Diagnosis not present

## 2022-02-28 DIAGNOSIS — R0789 Other chest pain: Secondary | ICD-10-CM | POA: Diagnosis not present

## 2022-02-28 MED ORDER — PANTOPRAZOLE SODIUM 20 MG PO TBEC: 20.0000 mg | DELAYED_RELEASE_TABLET | Freq: Every day | ORAL | 3 refills | Status: DC

## 2022-02-28 NOTE — Patient Instructions (Addendum)
Medication Instructions:  Continue your current medications.   *If you need a refill on your cardiac medications before your next appointment, please call your pharmacy*  Lab Work/Testing/Procedures: Your cardiac CTA showed no evidence of any plaque in your heart which is great!  Follow-Up: At [facility], you and your health needs are our priority.  As part of our continuing mission to provide you with exceptional heart care, we have created designated Provider Care Teams.  These Care Teams include your primary Cardiologist (physician) and Advanced Practice Providers (APPs -  Physician Assistants and Nurse Practitioners) who all work together to provide you with the care you need, when you need it.  We recommend signing up for the patient portal called "MyChart".  Sign up information is provided on this After Visit Summary.  MyChart is used to connect with patients for Virtual Visits (Telemedicine).  Patients are able to view lab/test results, encounter notes, upcoming appointments, etc.  Non-urgent messages can be sent to your provider as well.   To learn more about what you can do with MyChart, go to [email or web address].    Your next appointment:   As needed with Dr. Oval Linsey  Other Instructions  Food Choices for Gastroesophageal Reflux Disease, Adult When you have gastroesophageal reflux disease (GERD), the foods you eat and your eating habits are very important. Choosing the right foods can help ease your discomfort. Think about working with a food expert (dietitian) to help you make good choices. What are tips for following this plan? Reading food labels Look for foods that are low in saturated fat. Foods that may help with your symptoms include: Foods that have less than 5% of daily value (DV) of fat. Foods that have 0 grams of trans fat. Cooking Do not fry your food. Cook your food by baking, steaming, grilling, or broiling. These are all methods that do not need a  lot of fat for cooking. To add flavor, try to use herbs that are low in spice and acidity. Meal planning  Choose healthy foods that are low in fat, such as: Fruits and vegetables. Whole grains. Low-fat dairy products. Lean meats, fish, and poultry. Eat small meals often instead of eating 3 large meals each day. Eat your meals slowly in a place where you are relaxed. Avoid bending over or lying down until 2-3 hours after eating. Limit high-fat foods such as fatty meats or fried foods. Limit your intake of fatty foods, such as oils, butter, and shortening. Avoid the following as told by your doctor: Foods that cause symptoms. These may be different for different people. Keep a food diary to keep track of foods that cause symptoms. Alcohol. Drinking a lot of liquid with meals. Eating meals during the 2-3 hours before bed. Lifestyle Stay at a healthy weight. Ask your doctor what weight is healthy for you. If you need to lose weight, work with your doctor to do so safely. Exercise for at least 30 minutes on 5 or more days each week, or as told by your doctor. Wear loose-fitting clothes. Do not smoke or use any products that contain nicotine or tobacco. If you need help quitting, ask your doctor. Sleep with the head of your bed higher than your feet. Use a wedge under the mattress or blocks under the bed frame to raise the head of the bed. Chew sugar-free gum after meals. What foods should eat?  Eat a healthy, well-balanced diet of fruits, vegetables, whole grains, low-fat dairy products, lean meats,  fish, and poultry. Each person is different. Foods that may cause symptoms in one person may not cause any symptoms in another person. Work with your doctor to find foods that are safe for you. The items listed above may not be a complete list of what you can eat and drink. Contact a food expert for more options. What foods should I avoid? Limiting some of these foods may help in managing the  symptoms of GERD. Everyone is different. Talk with a food expert or your doctor to help you find the exact foods to avoid, if any. Fruits Any fruits prepared with added fat. Any fruits that cause symptoms. For some people, this may include citrus fruits, such as oranges, grapefruit, pineapple, and lemons. Vegetables Deep-fried vegetables. [place] fries. Any vegetables prepared with added fat. Any vegetables that cause symptoms. For some people, this may include tomatoes and tomato products, chili peppers, onions and garlic, and horseradish. Grains Pastries or quick breads with added fat. Meats and other proteins High-fat meats, such as fatty beef or pork, hot dogs, ribs, ham, sausage, salami, and bacon. Fried meat or protein, including fried fish and fried chicken. Nuts and nut butters, in large amounts. Dairy Whole milk and chocolate milk. Sour cream. Cream. Ice cream. Cream cheese. Milkshakes. Fats and oils Butter. Margarine. Shortening. Ghee. Beverages Coffee and tea, with or without caffeine. Carbonated beverages. Sodas. Energy drinks. Fruit juice made with acidic fruits, such as orange or grapefruit. Tomato juice. Alcoholic drinks. Sweets and desserts Chocolate and cocoa. Donuts. Seasonings and condiments Pepper. Peppermint and spearmint. Added salt. Any condiments, herbs, or seasonings that cause symptoms. For some people, this may include curry, hot sauce, or vinegar-based salad dressings. The items listed above may not be a complete list of what you should not eat and drink. Contact a food expert for more options. Questions to ask your doctor Diet and lifestyle changes are often the first steps that are taken to manage symptoms of GERD. If diet and lifestyle changes do not help, talk with your doctor about taking medicines. Where to find more information International Foundation for Gastrointestinal Disorders: aboutgerd.org Summary When you have GERD, food and lifestyle choices are  very important in easing your symptoms. Eat small meals often instead of 3 large meals a day. Eat your meals slowly and in a place where you are relaxed. Avoid bending over or lying down until 2-3 hours after eating. Limit high-fat foods such as fatty meats or fried foods. This information is not intended to replace advice given to you by your health care provider. Make sure you discuss any questions you have with your health care provider. Document Revised: 10/21/2019 Document Reviewed: 10/21/2019 Elsevier Patient Education  [place].

## 2022-02-28 NOTE — Progress Notes (Signed)
Office Visit    Patient Name: Megan Mills Date of Encounter: 02/28/2022  PCP:  Leamon Arnt, MD   [place]  Cardiologist:  Skeet Latch, MD  Advanced Practice Provider:  No care team member to display Electrophysiologist:  None      Chief Complaint    Megan Mills is a 51 y.o. female presents today for follow-up after cardiac testing  Past Medical History    Past Medical History:  Diagnosis Date   Allergy    Atypical chest pain 12/29/2021   Chicken pox    Hay fever    UTI (urinary tract infection)    Past Surgical History:  Procedure Laterality Date   KNEE ARTHROSCOPY Left     Allergies  Allergies  Allergen Reactions   Penicillins Other (See Comments)    Reported allergic as infant but tolerated amox 2022    History of Present Illness    Megan Mills is a 52 y.o. female with a hx of chest pain, GERD last seen 12/29/21.  Seen in consult 12/29/21 due to chest pain x 1 month described as "little  bubbles" as well as right jaw numbness. Chest discomfort worse after eating foods such as peanut butter, tomato sauce, ice cream. Active in circuit training 3x per week and pilates 3x per week. Subsequent coronary CTA with coronary calcium score of 0.  She presents today for follow up. Since last seen doing well. Reviewed CTA in detail. Shares with me her symptoms markedly improved 2-3 days after starting Protonix. She does have concerns about taking Protonix long term as she prefers to avoid medications if possible. Reports no shortness of breath nor dyspnea on exertion. Reports no chest pain, pressure, or tightness. No edema, orthopnea, PND. Reports no palpitations.    EKGs/Labs/Other Studies Reviewed:   The following studies were reviewed today:  Cardiac CTA 01/18/22   1. No evidence of CAD, 0 % stenosis, CADRADS 0.   2. Coronary calcium score of 0.   3. Normal coronary origins with LEFT dominance.      EKG:  EKG is not  ordered today.    Recent Labs: 11/26/2021: ALT 11; Hemoglobin 12.7; Platelets 292 12/29/2021: BUN 15; Creatinine, Ser 0.86; Potassium 4.3; Sodium 139  Recent Lipid Panel    Component Value Date/Time   CHOL 171 10/07/2021 0819   TRIG 43.0 10/07/2021 0819   HDL 43.30 10/07/2021 0819   CHOLHDL 4 10/07/2021 0819   VLDL 8.6 10/07/2021 0819   LDLCALC 119 (H) 10/07/2021 0819    Home Medications   Current Meds  Medication Sig   Calcium Carbonate-Vitamin D (CALCIUM PLUS VITAMIN D PO) Take 2 each by mouth daily. Calcium 500 mg and Vit D 700 IU   COLLAGEN PO Take by mouth.   fluticasone (FLONASE) 50 MCG/ACT nasal spray Place into both nostrils as needed for allergies or rhinitis.   levonorgestrel (MIRENA) 20 MCG/24HR IUD 1 each by Intrauterine route once. Every 5 years   pantoprazole (PROTONIX) 20 MG tablet Take 1 tablet (20 mg total) by mouth daily.   [DISCONTINUED] ivabradine (CORLANOR) 5 MG TABS tablet TAKE 1 TABLET 2 HOURS PRIOR TO CT   [DISCONTINUED] metoprolol tartrate (LOPRESSOR) 25 MG tablet TAKE 1 TABLET 2 HOURS PRIOR TO CT   [DISCONTINUED] pantoprazole (PROTONIX) 40 MG tablet Take 1 tablet (40 mg total) by mouth daily.     Review of Systems      All other systems reviewed and are  otherwise negative except as noted above.  Physical Exam    VS:  BP 120/76   Pulse 98   Ht '5\' 4"'$  (1.626 m)   Wt 198 lb (89.8 kg)   BMI 33.99 kg/m  , BMI Body mass index is 33.99 kg/m.  Wt Readings from Last 3 Encounters:  02/28/22 198 lb (89.8 kg)  12/29/21 194 lb 12.8 oz (88.4 kg)  11/26/21 190 lb (86.2 kg)    GEN: Well nourished, well developed, in no acute distress. HEENT: normal. Neck: Supple, no JVD, carotid bruits, or masses. Cardiac: RRR, no murmurs, rubs, or gallops. No clubbing, cyanosis, edema.  Radials/PT 2+ and equal bilaterally.  Respiratory:  Respirations regular and unlabored, clear to auscultation bilaterally. GI: Soft, nontender, nondistended. MS: No deformity or  atrophy. Skin: Warm and dry, no rash. Neuro:  Strength and sensation are intact. Psych: Normal affect.  Assessment & Plan    Chest pain - Atypical for angina. Coronary CTA 01/2022 coronary calcium score of 0. No indication for further cardiac workup. The 10-year ASCVD risk score (Arnett DK, et al., 2019) is: 1.3%. Reassurance provided.   GERD - Symptoms much improved since starting on Protonix. She understandably prefers to be on less medication. Will reduce to Protonix '20mg'$  daily. Discussed GERD friendly diet. If she wishes to discontinue, we discussed she could trial off the medication and resume if symptoms recurred.   Obesity BMI 33 - Weight loss via diet and exercise encouraged. Discussed the impact being overweight would have on cardiovascular risk.        Disposition: Follow up prn with Skeet Latch, MD or APP.  Signed, Loel Dubonnet, NP 02/28/2022, 1:44 PM [place]

## 2022-03-24 ENCOUNTER — Other Ambulatory Visit (HOSPITAL_BASED_OUTPATIENT_CLINIC_OR_DEPARTMENT_OTHER): Payer: Self-pay | Admitting: Cardiovascular Disease

## 2022-09-21 ENCOUNTER — Encounter: Payer: Self-pay | Admitting: Family Medicine

## 2022-09-21 ENCOUNTER — Ambulatory Visit (INDEPENDENT_AMBULATORY_CARE_PROVIDER_SITE_OTHER): Payer: Managed Care, Other (non HMO) | Admitting: Family Medicine

## 2022-09-21 VITALS — BP 122/80 | HR 104 | Temp 98.2°F | Ht 64.0 in | Wt 206.0 lb

## 2022-09-21 DIAGNOSIS — Z0001 Encounter for general adult medical examination with abnormal findings: Secondary | ICD-10-CM

## 2022-09-21 DIAGNOSIS — Z975 Presence of (intrauterine) contraceptive device: Secondary | ICD-10-CM | POA: Diagnosis not present

## 2022-09-21 DIAGNOSIS — R0789 Other chest pain: Secondary | ICD-10-CM | POA: Diagnosis not present

## 2022-09-21 DIAGNOSIS — G8929 Other chronic pain: Secondary | ICD-10-CM

## 2022-09-21 DIAGNOSIS — K219 Gastro-esophageal reflux disease without esophagitis: Secondary | ICD-10-CM | POA: Diagnosis not present

## 2022-09-21 DIAGNOSIS — Z13220 Encounter for screening for lipoid disorders: Secondary | ICD-10-CM | POA: Diagnosis not present

## 2022-09-21 DIAGNOSIS — M25512 Pain in left shoulder: Secondary | ICD-10-CM | POA: Diagnosis not present

## 2022-09-21 DIAGNOSIS — D126 Benign neoplasm of colon, unspecified: Secondary | ICD-10-CM | POA: Diagnosis not present

## 2022-09-21 DIAGNOSIS — J301 Allergic rhinitis due to pollen: Secondary | ICD-10-CM

## 2022-09-21 DIAGNOSIS — Z0000 Encounter for general adult medical examination without abnormal findings: Secondary | ICD-10-CM

## 2022-09-21 DIAGNOSIS — F430 Acute stress reaction: Secondary | ICD-10-CM

## 2022-09-21 LAB — LIPID PANEL
Cholesterol: 203 mg/dL — ABNORMAL HIGH (ref 0–200)
HDL: 52 mg/dL (ref >39.00)
LDL Cholesterol: 141 mg/dL — ABNORMAL HIGH (ref 0–99)
NonHDL: 150.99
Total CHOL/HDL Ratio: 4
Triglycerides: 50 mg/dL (ref 0.0–149.0)
VLDL: 10 mg/dL (ref 0.0–40.0)

## 2022-09-21 LAB — CBC WITH DIFFERENTIAL/PLATELET
Basophils Absolute: 0.1 10*3/uL (ref 0.0–0.1)
Basophils Relative: 1 % (ref 0.0–3.0)
Eosinophils Absolute: 0.2 10*3/uL (ref 0.0–0.7)
Eosinophils Relative: 2.6 % (ref 0.0–5.0)
HCT: 41.5 % (ref 36.0–46.0)
Hemoglobin: 13.3 g/dL (ref 12.0–15.0)
Lymphocytes Relative: 22.7 % (ref 12.0–46.0)
Lymphs Abs: 1.7 10*3/uL (ref 0.7–4.0)
MCHC: 32.1 g/dL (ref 30.0–36.0)
MCV: 87.6 fl (ref 78.0–100.0)
Monocytes Absolute: 0.4 10*3/uL (ref 0.1–1.0)
Monocytes Relative: 5.6 % (ref 3.0–12.0)
Neutro Abs: 5.1 10*3/uL (ref 1.4–7.7)
Neutrophils Relative %: 68.1 % (ref 43.0–77.0)
Platelets: 332 10*3/uL (ref 150.0–400.0)
RBC: 4.73 Mil/uL (ref 3.87–5.11)
RDW: 13.5 % (ref 11.5–15.5)
WBC: 7.5 10*3/uL (ref 4.0–10.5)

## 2022-09-21 LAB — COMPREHENSIVE METABOLIC PANEL
ALT: 14 U/L (ref 0–35)
AST: 16 U/L (ref 0–37)
Albumin: 4.5 g/dL (ref 3.5–5.2)
Alkaline Phosphatase: 152 U/L — ABNORMAL HIGH (ref 39–117)
BUN: 13 mg/dL (ref 6–23)
CO2: 29 mEq/L (ref 19–32)
Calcium: 9.5 mg/dL (ref 8.4–10.5)
Chloride: 103 mEq/L (ref 96–112)
Creatinine, Ser: 0.9 mg/dL (ref 0.40–1.20)
GFR: 73.94 mL/min (ref >60.00)
Glucose, Bld: 101 mg/dL — ABNORMAL HIGH (ref 70–99)
Potassium: 4.2 mEq/L (ref 3.5–5.1)
Sodium: 142 mEq/L (ref 135–145)
Total Bilirubin: 0.5 mg/dL (ref 0.2–1.2)
Total Protein: 7.4 g/dL (ref 6.0–8.3)

## 2022-09-21 LAB — TSH: TSH: 0.77 u[IU]/mL (ref 0.35–5.50)

## 2022-09-21 NOTE — Patient Instructions (Signed)
Please return in 12 months for your annual complete physical; please come fasting.   I will release your lab results to you on your MyChart account with further instructions. You may see the results before I do, but when I review them I will send you a message with my report or have my assistant call you if things need to be discussed. Please reply to my message with any questions. Thank you!   Let me know if you are interested in trying physical therapy for your left shoulder pain.  Monitor your mood.  Hopefully you will have an excellent summer.  If you have any questions or concerns, please don't hesitate to send me a message via MyChart or call the office at [phone]. Thank you for visiting with Megan Mills today! It's our pleasure caring for you.   Shoulder Exercises Ask your health care provider which exercises are safe for you. Do exercises exactly as told by your health care provider and adjust them as directed. It is normal to feel mild stretching, pulling, tightness, or discomfort as you do these exercises. Stop right away if you feel sudden pain or your pain gets worse. Do not begin these exercises until told by your health care provider. Stretching exercises External rotation and abduction This exercise is sometimes called corner stretch. The exercise rotates your arm outward (external rotation) and moves your arm out from your body (abduction). Stand in a doorway with one of your feet slightly in front of the other. This is called a staggered stance. If you cannot reach your forearms to the door frame, stand facing a corner of a room. Choose one of the following positions as told by your health care provider: Place your hands and forearms on the door frame above your head. Place your hands and forearms on the door frame at the height of your head. Place your hands on the door frame at the height of your elbows. Slowly move your weight onto your front foot until you feel a stretch across  your chest and in the front of your shoulders. Keep your head and chest upright and keep your abdominal muscles tight. Hold for __________ seconds. To release the stretch, shift your weight to your back foot. Repeat __________ times. Complete this exercise __________ times a day. Extension, standing  Stand and hold a broomstick, a cane, or a similar object behind your back. Your hands should be a little wider than shoulder-width apart. Your palms should face away from your back. Keeping your elbows straight and your shoulder muscles relaxed, move the stick away from your body until you feel a stretch in your shoulders (extension). Avoid shrugging your shoulders while you move the stick. Keep your shoulder blades tucked down toward the middle of your back. Hold for __________ seconds. Slowly return to the starting position. Repeat __________ times. Complete this exercise __________ times a day. Range-of-motion exercises Pendulum  Stand near a wall or a surface that you can hold onto for balance. Bend at the waist and let your left / right arm hang straight down. Use your other arm to support you. Keep your back straight and do not lock your knees. Relax your left / right arm and shoulder muscles, and move your hips and your trunk so your left / right arm swings freely. Your arm should swing because of the motion of your body, not because you are using your arm or shoulder muscles. Keep moving your hips and trunk so your arm swings in  the following directions, as told by your health care provider: Side to side. Forward and backward. In clockwise and counterclockwise circles. Continue each motion for __________ seconds, or for as long as told by your health care provider. Slowly return to the starting position. Repeat __________ times. Complete this exercise __________ times a day. Shoulder flexion, standing  Stand and hold a broomstick, a cane, or a similar object. Place your hands a  little more than shoulder-width apart on the object. Your left / right hand should be palm-up, and your other hand should be palm-down. Keep your elbow straight and your shoulder muscles relaxed. Push the stick up with your healthy arm to raise your left / right arm in front of your body, and then over your head until you feel a stretch in your shoulder (flexion). Avoid shrugging your shoulder while you raise your arm. Keep your shoulder blade tucked down toward the middle of your back. Hold for __________ seconds. Slowly return to the starting position. Repeat __________ times. Complete this exercise __________ times a day. Shoulder abduction, standing  Stand and hold a broomstick, a cane, or a similar object. Place your hands a little more than shoulder-width apart on the object. Your left / right hand should be palm-up, and your other hand should be palm-down. Keep your elbow straight and your shoulder muscles relaxed. Push the object across your body toward your left / right side. Raise your left / right arm to the side of your body (abduction) until you feel a stretch in your shoulder. Do not raise your arm above shoulder height unless your health care provider tells you to do that. If directed, raise your arm over your head. Avoid shrugging your shoulder while you raise your arm. Keep your shoulder blade tucked down toward the middle of your back. Hold for __________ seconds. Slowly return to the starting position. Repeat __________ times. Complete this exercise __________ times a day. Internal rotation  Place your left / right hand behind your back, palm-up. Use your other hand to dangle an exercise band, a broomstick, or a similar object over your shoulder. Grasp the band with your left / right hand so you are holding on to both ends. Gently pull up on the band until you feel a stretch in the front of your left / right shoulder. The movement of your arm toward the center of your body is  called internal rotation. Avoid shrugging your shoulder while you raise your arm. Keep your shoulder blade tucked down toward the middle of your back. Hold for __________ seconds. Release the stretch by letting go of the band and lowering your hands. Repeat __________ times. Complete this exercise __________ times a day. Strengthening exercises External rotation  Sit in a stable chair without armrests. Secure an exercise band to a stable object at elbow height on your left / right side. Place a soft object, such as a folded towel or a small pillow, between your left / right upper arm and your body to move your elbow about 4 inches (10 cm) away from your side. Hold the end of the exercise band so it is tight and there is no slack. Keeping your elbow pressed against the soft object, slowly move your forearm out, away from your abdomen (external rotation). Keep your body steady so only your forearm moves. Hold for __________ seconds. Slowly return to the starting position. Repeat __________ times. Complete this exercise __________ times a day. Shoulder abduction  Sit in a stable  chair without armrests, or stand up. Hold a __________ lb / kg weight in your left / right hand, or hold an exercise band with both hands. Start with your arms straight down and your left / right palm facing in, toward your body. Slowly lift your left / right hand out to your side (abduction). Do not lift your hand above shoulder height unless your health care provider tells you that this is safe. Keep your arms straight. Avoid shrugging your shoulder while you do this movement. Keep your shoulder blade tucked down toward the middle of your back. Hold for __________ seconds. Slowly lower your arm, and return to the starting position. Repeat __________ times. Complete this exercise __________ times a day. Shoulder extension  Sit in a stable chair without armrests, or stand up. Secure an exercise band to a stable  object in front of you so it is at shoulder height. Hold one end of the exercise band in each hand. Straighten your elbows and lift your hands up to shoulder height. Squeeze your shoulder blades together as you pull your hands down to the sides of your thighs (extension). Stop when your hands are straight down by your sides. Do not let your hands go behind your body. Hold for __________ seconds. Slowly return to the starting position. Repeat __________ times. Complete this exercise __________ times a day. Shoulder row  Sit in a stable chair without armrests, or stand up. Secure an exercise band to a stable object in front of you so it is at chest height. Hold one end of the exercise band in each hand. Position your palms so that your thumbs are facing the ceiling (neutral position). Bend each of your elbows to a 90-degree angle (right angle) and keep your upper arms at your sides. Step back or move the chair back until the band is tight and there is no slack. Slowly pull your elbows back behind you. Hold for __________ seconds. Slowly return to the starting position. Repeat __________ times. Complete this exercise __________ times a day. Shoulder press-ups  Sit in a stable chair that has armrests. Sit upright, with your feet flat on the floor. Put your hands on the armrests so your elbows are bent and your fingers are pointing forward. Your hands should be about even with the sides of your body. Push down on the armrests and use your arms to lift yourself off the chair. Straighten your elbows and lift yourself up as much as you comfortably can. Move your shoulder blades down, and avoid letting your shoulders move up toward your ears. Keep your feet on the ground. As you get stronger, your feet should support less of your body weight as you lift yourself up. Hold for __________ seconds. Slowly lower yourself back into the chair. Repeat __________ times. Complete this exercise __________  times a day. Wall push-ups  Stand so you are facing a stable wall. Your feet should be about one arm-length away from the wall. Lean forward and place your palms on the wall at shoulder height. Keep your feet flat on the floor as you bend your elbows and lean forward toward the wall. Hold for __________ seconds. Straighten your elbows to push yourself back to the starting position. Repeat __________ times. Complete this exercise __________ times a day. This information is not intended to replace advice given to you by your health care provider. Make sure you discuss any questions you have with your health care provider. Document Revised: 06/01/2021 Document Reviewed:  06/01/2021 Elsevier Patient Education  2024 [employer].

## 2022-09-21 NOTE — Progress Notes (Signed)
Subjective  Chief Complaint  Patient presents with   Annual Exam    Pt here for Annual exam and is currently fasting     HPI: Megan Mills is a 52 y.o. female who presents to [facility] today for a Female Wellness Visit. She also has the concerns and/or needs as listed above in the chief complaint. These will be addressed in addition to the Health Maintenance Visit.   Wellness Visit: annual visit with health maintenance review and exam without Pap  Health maintenance: Pap current.  IUD in place.  Doing well overall.  Mammogram up-to-date.  Colorectal cancer screening reviewed, sessile serrated polyp in 2022.  Repeat in 5 years. Chronic disease f/u and/or acute problem visit: (deemed necessary to be done in addition to the wellness visit): Atypical chest pain: 0 coronary calcium score, very low risk.  Pain resolved with Protonix. GERD: Taking Protonix 20 mg daily.  This is down from 40 daily.  Does very well.  Had 1 episode of breakthrough symptoms with peanut butter.  She has a few triggers.  Would like to come off the medicine if possible.  Has not used Pepcid or Tums etc. Reviewed colonoscopy from August 2022.  Sessile serrated polyp.  No melena or GI symptoms.  Due for repeat in 2027 IUD in place.  No pain or problems Stress reaction: Had a stressful year.  Abnormal mammogram with normal follow-up, father-in-law declining with Parkinson's, multiple falls, ER visits and now transitioning to memory care nursing home.  She has been the primary caretaker.  Had some seasonal affective symptoms during the winter as well.  Fortunately, she says she is now doing better.  No history of mood problems. Seasonal allergies were worse this year.  Had to restart her Flonase.  Now well-controlled.  No sinus infection symptoms Complains of shoulder pain that started in November.  Has mostly resolved.  Now has some minor discomfort or stiffness during Pilates.  No radicular  symptoms.  No neck pain.  Has not needed medications for it.  Assessment  1. Annual physical exam   2. Atypical chest pain   3. Gastroesophageal reflux disease, unspecified whether esophagitis present   4. Sessile serrated polyp of colon   5. IUD (intrauterine device) in place - Mirena   6. Stress reaction   7. Seasonal allergic rhinitis due to pollen   8. Chronic left shoulder pain      Plan  Female Wellness Visit: Age appropriate Health Maintenance and Prevention measures were discussed with patient. Included topics are cancer screening recommendations, ways to keep healthy (see AVS) including dietary and exercise recommendations, regular eye and dental care, use of seat belts, and avoidance of moderate alcohol use and tobacco use.  Screens are current BMI: discussed patient's BMI and encouraged positive lifestyle modifications to help get to or maintain a target BMI. HM needs and immunizations were addressed and ordered. See below for orders. See HM and immunization section for updates. Routine labs and screening tests ordered including cmp, cbc and lipids where appropriate. Discussed recommendations regarding Vit D and calcium supplementation (see AVS)  Chronic disease management visit and/or acute problem visit: Atypical chest pain: She is reassured.  Noncardiac GERD: Well-controlled on Protonix 20 mg daily.  Recommend stopping and using as needed or trial of Tums or Pepcid.  Monitor symptoms.  If worsens significantly can restart.  Patient understands and agrees with care plan Colon cancer screenings are current. IUD placed 2021. Stress  reaction: Counseling done.  Improved.  Monitor for seasonal affective disorder next year.  Follow-up here if needed for stress regarding her father-in-law's care etc. if needed. Left shoulder pain: Possible tendinopathy versus arthritis.  Normal exam today.  Excellent range of motion.  Consider a few sessions of physical therapy to learn strengthening  and healing exercises if needed.  Patient to consider.  Follow up: 12 months for complete physical Orders Placed This Encounter  Procedures   CBC with Differential/Platelet   Comprehensive metabolic panel   Lipid panel   TSH   No orders of the defined types were placed in this encounter.     Body mass index is 35.36 kg/m. Wt Readings from Last 3 Encounters:  09/21/22 206 lb (93.4 kg)  02/28/22 198 lb (89.8 kg)  12/29/21 194 lb 12.8 oz (88.4 kg)     Patient Active Problem List   Diagnosis Date Noted   GERD (gastroesophageal reflux disease) 09/21/2022    Responded to Protonix, 2023    Atypical chest pain 12/29/2021    Cardiac CTA 01/18/22 1. No evidence of CAD, 0 % stenosis, CADRADS 0. 2. Coronary calcium score of 0. 3. Normal coronary origins with LEFT dominance Due to GERD: resolved with protonix Dr. Duke Salvia cardiology    Sessile serrated polyp of colon 09/16/2021    Colonoscopy August 2022, Dr. Orvan Falconer.  Repeat in 5 years    Allergic rhinitis due to pollen 10/03/2019   Trichotillomania 10/03/2019   IUD (intrauterine device) in place - Mirena 10/03/2019    2nd placed 11/2019 Dr. Vincente Poli.  (of note HM is incorrect (pap was not repeated in 11/2019)    Obesity (BMI 30-39.9) 10/03/2019   Health Maintenance  Topic Date Due   INFLUENZA VACCINE  11/24/2022   MAMMOGRAM  12/14/2022   Colonoscopy  12/23/2025   PAP SMEAR-Modifier  02/23/2026   DTaP/Tdap/Td (2 - Td or Tdap) 04/22/2026   COVID-19 Vaccine  Completed   Hepatitis C Screening  Completed   HIV Screening  Completed   Zoster Vaccines- Shingrix  Completed   HPV VACCINES  Aged Out   Immunization History  Administered Date(s) Administered   Influenza, Seasonal, Injecte, Preservative Fre 12/24/2012, 01/15/2014   Influenza,inj,Quad PF,6+ Mos 01/24/2020   PFIZER(Purple Top)SARS-COV-2 Vaccination 07/26/2019, 08/21/2019, 03/16/2020, 01/27/2021   Tdap 04/22/2016   Zoster Recombinat (Shingrix) 09/16/2021, 11/16/2021    We updated and reviewed the patient's past history in detail and it is documented below. Allergies: Patient is allergic to penicillins. Past Medical History Patient  has a past medical history of Allergy, Atypical chest pain (12/29/2021), Chicken pox, Hay fever, and UTI (urinary tract infection). Past Surgical History Patient  has a past surgical history that includes Knee arthroscopy (Left). Family History: Patient family history includes Bipolar disorder in her mother; Depression in her mother; Hearing loss in her maternal grandmother; Heart attack in her paternal grandmother; Hypertension in her maternal grandmother and mother; Mental illness in her mother. Social History:  Patient  reports that she has never smoked. She has never used smokeless tobacco. She reports that she does not currently use alcohol. She reports that she does not use drugs.  Review of Systems: Constitutional: negative for fever or malaise Ophthalmic: negative for photophobia, double vision or loss of vision Cardiovascular: negative for chest pain, dyspnea on exertion, or new LE swelling Respiratory: negative for SOB or persistent cough Gastrointestinal: negative for abdominal pain, change in bowel habits or melena Genitourinary: negative for dysuria or gross hematuria, no abnormal uterine  bleeding or disharge Musculoskeletal: negative for new gait disturbance or muscular weakness Integumentary: negative for new or persistent rashes, no breast lumps Neurological: negative for TIA or stroke symptoms Psychiatric: negative for SI or delusions Allergic/Immunologic: negative for hives  Patient Care Team    Relationship Specialty Notifications Start End  Willow Ora, MD PCP - General Family Medicine  10/03/19   Chilton Si, MD PCP - Cardiology Cardiology  12/29/21   Elesa Hacker, MD Referring Physician Dermatology  10/03/19   Marcelle Overlie, MD Consulting Physician Obstetrics and Gynecology   09/10/20     Objective  Vitals: BP 122/80   Pulse (!) 104   Temp 98.2 F (36.8 C)   Ht 5\' 4"  (1.626 m)   Wt 206 lb (93.4 kg)   SpO2 98%   BMI 35.36 kg/m  General:  Well developed, well nourished, no acute distress  Psych:  Alert and orientedx3,normal mood and affect HEENT:  Normocephalic, atraumatic, non-icteric sclera,  supple neck without adenopathy, mass or thyromegaly Cardiovascular:  Normal S1, S2, RRR without gallop, rub or murmur Respiratory:  Good breath sounds bilaterally, CTAB with normal respiratory effort Gastrointestinal: normal bowel sounds, soft, non-tender, no noted masses. No HSM MSK: extremities without edema, joints without erythema or swelling, normal shoulder exam, left Neurologic:    Mental status is normal.  Gross motor and sensory exams are normal.  No tremor  Commons side effects, risks, benefits, and alternatives for medications and treatment plan prescribed today were discussed, and the patient expressed understanding of the given instructions. Patient is instructed to call or message via MyChart if he/she has any questions or concerns regarding our treatment plan. No barriers to understanding were identified. We discussed Red Flag symptoms and signs in detail. Patient expressed understanding regarding what to do in case of urgent or emergency type symptoms.  Medication list was reconciled, printed and provided to the patient in AVS. Patient instructions and summary information was reviewed with the patient as documented in the AVS. This note was prepared with assistance of Dragon voice recognition software. Occasional wrong-word or sound-a-like substitutions may have occurred due to the inherent limitations of voice recognition software

## 2022-09-22 NOTE — Progress Notes (Signed)
Labs reviewed.  Mild elevated alk phos, this is so surreal.  The 10-year ASCVD risk score (Arnett DK, et al., 2019) is: 1.3%   Values used to calculate the score:     Age: 52 years     Sex: Female     Is Non-Hispanic African American: No     Diabetic: No     Tobacco smoker: No     Systolic Blood Pressure: 122 mmHg     Is BP treated: No     HDL Cholesterol: 52 mg/dL     Total Cholesterol: 203 mg/dL  Dear Ms. Megan Mills, Thank you for allowing me to care for you at your recent office visit.  I wanted to let you know that I have reviewed your lab test results and am happy to report that they are stable.  Things look good.  Your alkaline phosphatase is mildly elevated.  This can come from many things.  It is very nonspecific.  Nothing further is needed at this time.  I will monitor it for you.  No changes are needed. Take care Sincerely, Dr. Mardelle Matte

## 2022-09-30 ENCOUNTER — Telehealth: Payer: Self-pay | Admitting: Family Medicine

## 2022-09-30 NOTE — Telephone Encounter (Signed)
PT billed for separate visit at CPE 09/21/22. Questioning charges.

## 2022-10-31 ENCOUNTER — Other Ambulatory Visit: Payer: Self-pay | Admitting: Obstetrics and Gynecology

## 2022-10-31 DIAGNOSIS — Z1231 Encounter for screening mammogram for malignant neoplasm of breast: Secondary | ICD-10-CM

## 2023-01-02 ENCOUNTER — Ambulatory Visit
Admission: RE | Admit: 2023-01-02 | Discharge: 2023-01-02 | Disposition: A | Discharge status: Home / Self Care | Payer: Managed Care, Other (non HMO) | Source: Ambulatory Visit | Attending: Obstetrics and Gynecology | Admitting: Obstetrics and Gynecology

## 2023-01-02 DIAGNOSIS — Z1231 Encounter for screening mammogram for malignant neoplasm of breast: Secondary | ICD-10-CM

## 2023-02-02 IMAGING — MG MM DIGITAL SCREENING BILAT W/ TOMO AND CAD
8 series · 8 of 24 positions shown · non-contrast
Comparison: Previous exam(s).

CLINICAL DATA: Screening.

EXAM:
DIGITAL SCREENING BILATERAL MAMMOGRAM WITH TOMOSYNTHESIS AND CAD
TECHNIQUE: Bilateral screening digital craniocaudal and mediolateral oblique
mammograms were obtained. Bilateral screening digital breast
tomosynthesis was performed. The images were evaluated with
computer-aided detection.

[L CC synth-2D]
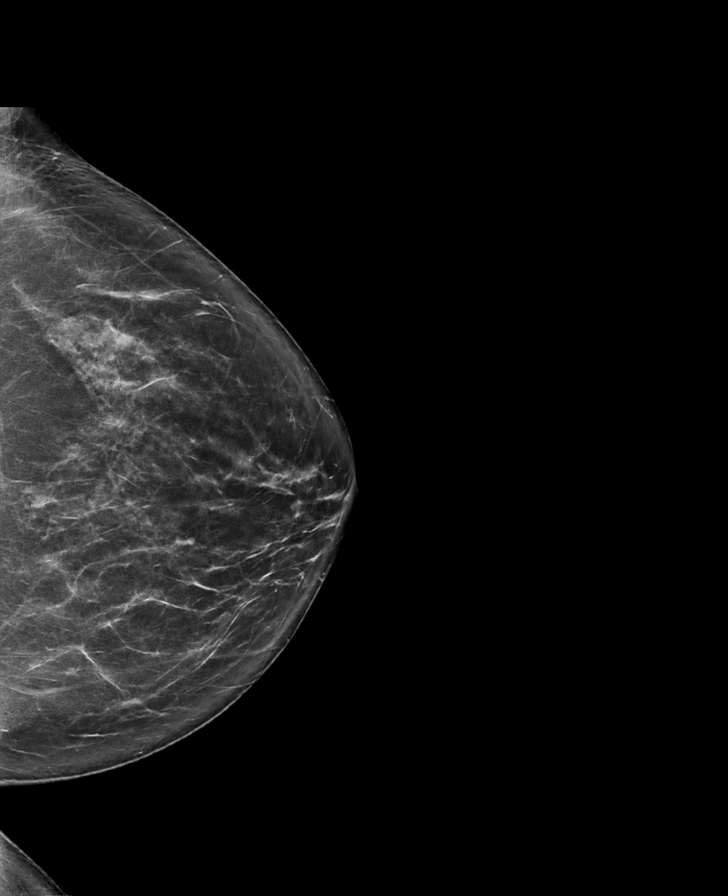

[R CC synth-2D]
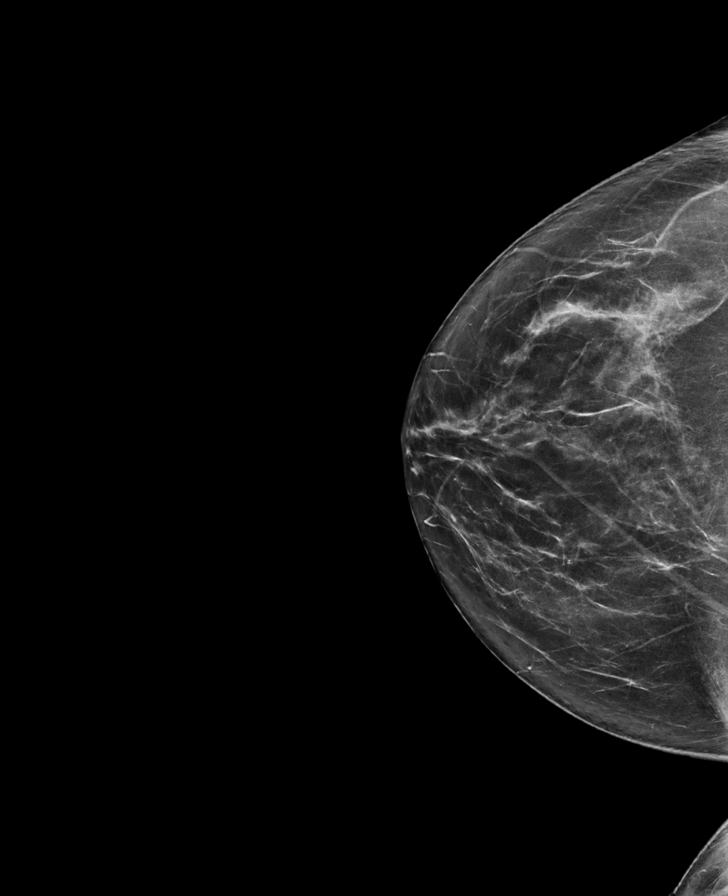

[R MLO synth-2D]
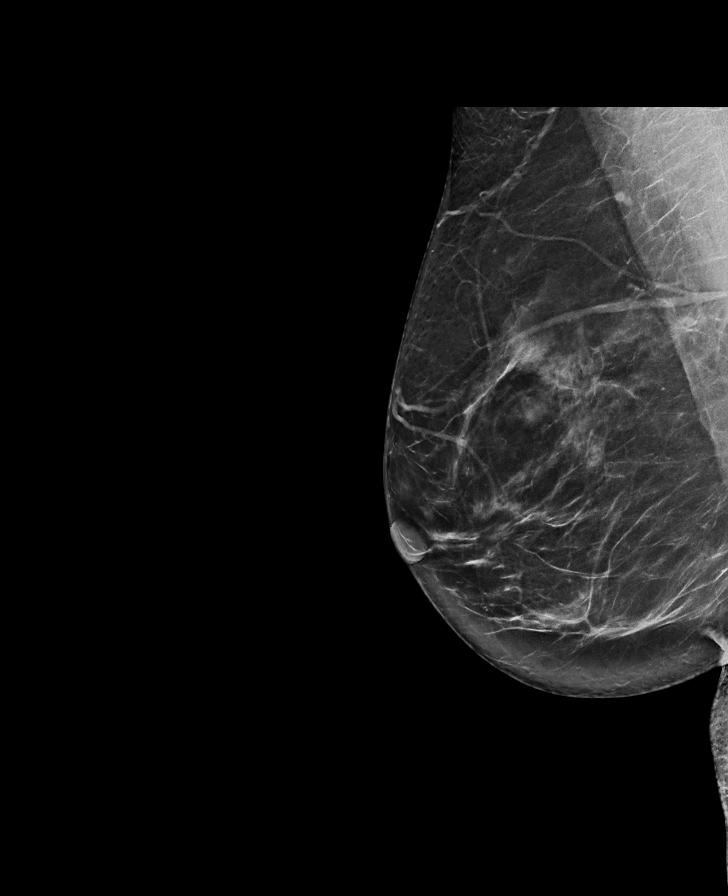

[L MLO synth-2D]
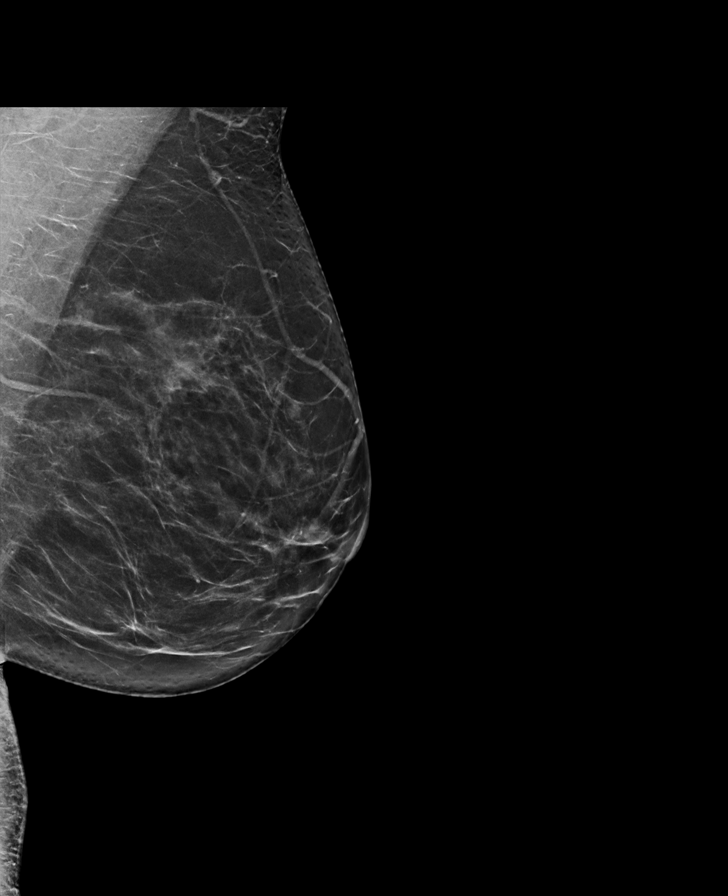

[L MLO tomo · tomo slice 39/76.0]
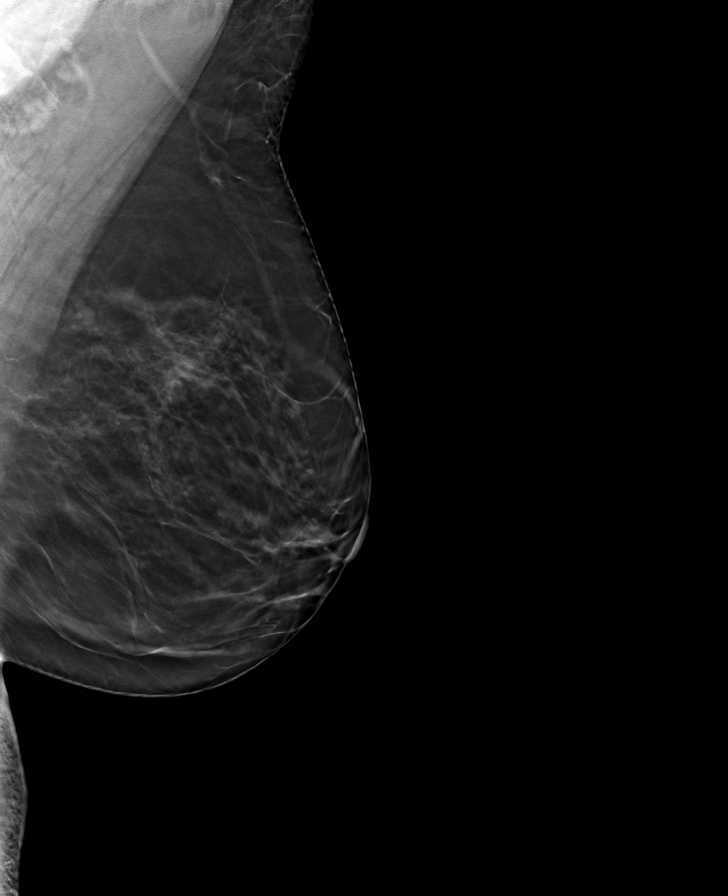

[R CC tomo · tomo slice 39/77.0]
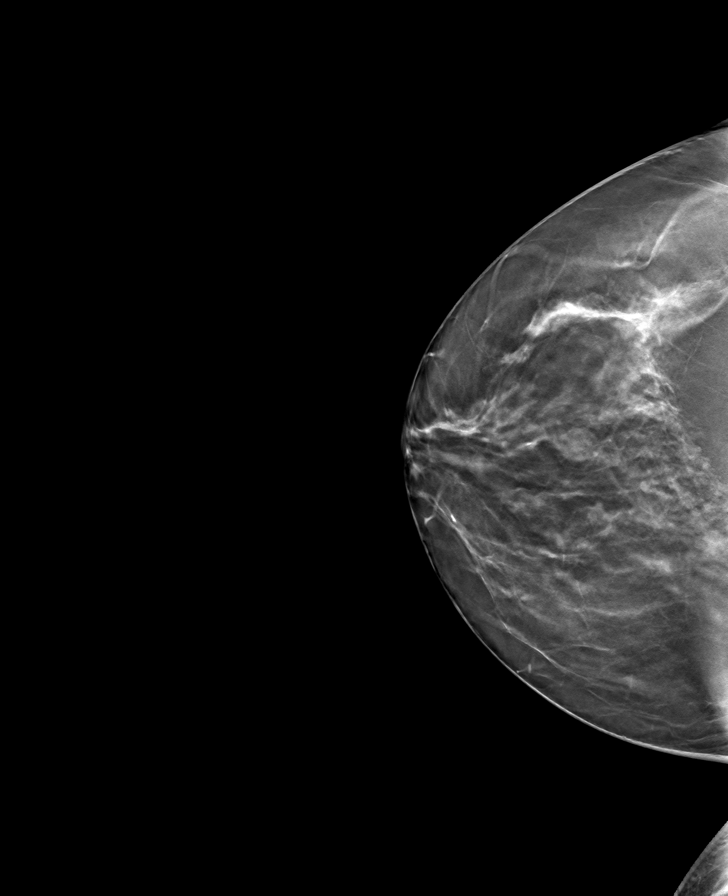

[L CC tomo · tomo slice 37/73.0]
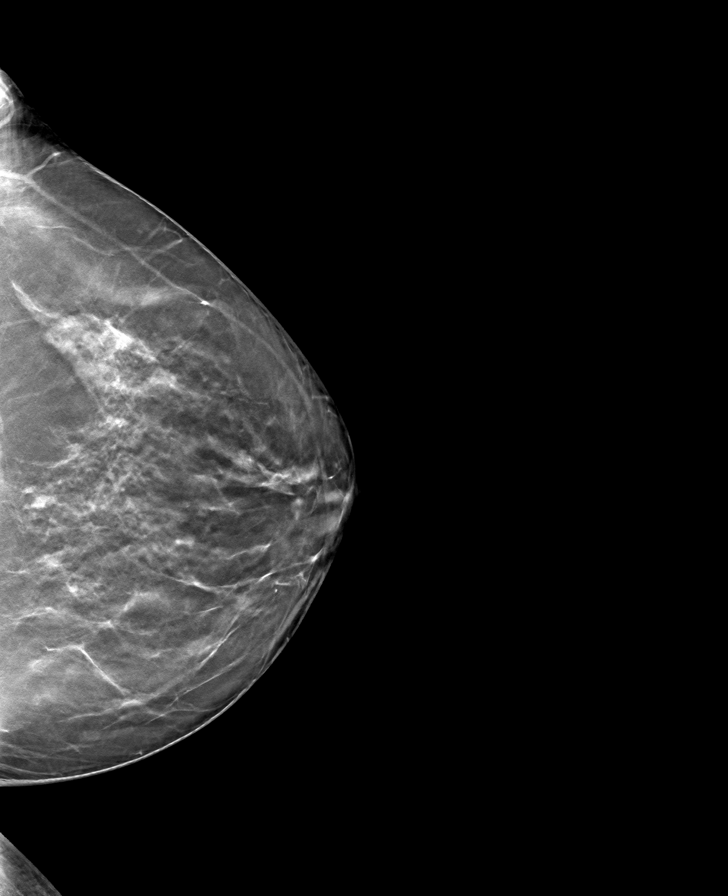

[R MLO tomo · tomo slice 41/82.0]
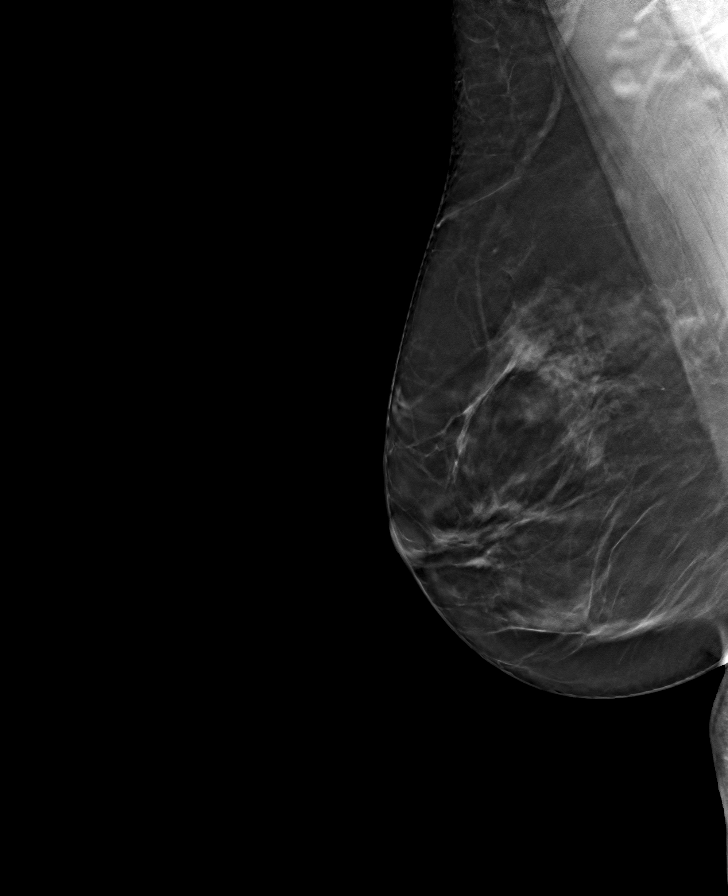

[8 of 24 positions shown; findings below may reference images not displayed]

ACR Breast Density Category b: There are scattered areas of
fibroglandular density.
FINDINGS: There are no findings suspicious for malignancy.
IMPRESSION: No mammographic evidence of malignancy. A result letter of this
screening mammogram will be mailed directly to the patient.

RECOMMENDATION:
Screening mammogram in one year. (Code:51-O-LD2)

BI-RADS CATEGORY  1: Negative.

## 2023-02-21 ENCOUNTER — Other Ambulatory Visit (HOSPITAL_BASED_OUTPATIENT_CLINIC_OR_DEPARTMENT_OTHER): Payer: Self-pay | Admitting: Family

## 2023-02-21 DIAGNOSIS — K219 Gastro-esophageal reflux disease without esophagitis: Secondary | ICD-10-CM

## 2023-09-22 ENCOUNTER — Encounter: Payer: Managed Care, Other (non HMO) | Admitting: Family Medicine

## 2023-11-28 ENCOUNTER — Other Ambulatory Visit: Payer: Self-pay | Admitting: Obstetrics and Gynecology

## 2023-11-28 DIAGNOSIS — Z1231 Encounter for screening mammogram for malignant neoplasm of breast: Secondary | ICD-10-CM

## 2024-01-03 ENCOUNTER — Ambulatory Visit

## 2024-01-10 ENCOUNTER — Ambulatory Visit
Admission: RE | Admit: 2024-01-10 | Discharge: 2024-01-10 | Disposition: A | Discharge status: Home / Self Care | Source: Ambulatory Visit | Attending: Obstetrics and Gynecology | Admitting: Obstetrics and Gynecology

## 2024-01-10 DIAGNOSIS — Z1231 Encounter for screening mammogram for malignant neoplasm of breast: Secondary | ICD-10-CM
